# Patient Record
Sex: Female | Born: 1960 | Race: White | Hispanic: No | Smoking: Former smoker
Health system: Southern US, Community
[De-identification: ages and names within clinical notes are randomized; demographics above are authoritative.]

## PROBLEM LIST (undated history)

## (undated) DIAGNOSIS — K759 Inflammatory liver disease, unspecified: Secondary | ICD-10-CM

## (undated) DIAGNOSIS — E7800 Pure hypercholesterolemia, unspecified: Secondary | ICD-10-CM

## (undated) DIAGNOSIS — F329 Major depressive disorder, single episode, unspecified: Secondary | ICD-10-CM

## (undated) DIAGNOSIS — F32A Depression, unspecified: Secondary | ICD-10-CM

## (undated) DIAGNOSIS — G8929 Other chronic pain: Secondary | ICD-10-CM

## (undated) DIAGNOSIS — M75100 Unspecified rotator cuff tear or rupture of unspecified shoulder, not specified as traumatic: Secondary | ICD-10-CM

## (undated) DIAGNOSIS — Z973 Presence of spectacles and contact lenses: Secondary | ICD-10-CM

## (undated) DIAGNOSIS — Z8719 Personal history of other diseases of the digestive system: Secondary | ICD-10-CM

## (undated) DIAGNOSIS — K2270 Barrett's esophagus without dysplasia: Secondary | ICD-10-CM

## (undated) DIAGNOSIS — M1990 Unspecified osteoarthritis, unspecified site: Secondary | ICD-10-CM

## (undated) DIAGNOSIS — M549 Dorsalgia, unspecified: Secondary | ICD-10-CM

## (undated) DIAGNOSIS — F419 Anxiety disorder, unspecified: Secondary | ICD-10-CM

## (undated) DIAGNOSIS — K219 Gastro-esophageal reflux disease without esophagitis: Secondary | ICD-10-CM

## (undated) DIAGNOSIS — E039 Hypothyroidism, unspecified: Secondary | ICD-10-CM

## (undated) DIAGNOSIS — E119 Type 2 diabetes mellitus without complications: Secondary | ICD-10-CM

## (undated) DIAGNOSIS — I10 Essential (primary) hypertension: Secondary | ICD-10-CM

## (undated) HISTORY — DX: Barrett's esophagus without dysplasia: K22.70

## (undated) HISTORY — PX: BACK SURGERY: SHX140

## (undated) HISTORY — PX: CYSTECTOMY: SUR359

## (undated) HISTORY — PX: CARPAL TUNNEL RELEASE: SHX101

## (undated) HISTORY — PX: COLONOSCOPY: SHX174

## (undated) HISTORY — PX: HERNIA REPAIR: SHX51

## (undated) HISTORY — DX: Essential (primary) hypertension: I10

## (undated) HISTORY — PX: KNEE SURGERY: SHX244

## (undated) HISTORY — PX: SHOULDER ARTHROSCOPY: SHX128

## (undated) HISTORY — PX: OTHER SURGICAL HISTORY: SHX169

## (undated) HISTORY — PX: ANAL FISSURE REPAIR: SHX2312

## (undated) HISTORY — PX: CERVICAL ABLATION: SHX5771

---

## 2000-05-04 ENCOUNTER — Encounter: Admission: RE | Admit: 2000-05-04 | Discharge: 2000-05-04 | Payer: Self-pay | Admitting: Family Medicine

## 2000-05-04 ENCOUNTER — Encounter: Payer: Self-pay | Admitting: Family Medicine

## 2007-08-28 ENCOUNTER — Encounter: Admission: RE | Admit: 2007-08-28 | Discharge: 2007-08-28 | Payer: Self-pay | Admitting: General Surgery

## 2013-05-19 ENCOUNTER — Encounter (INDEPENDENT_AMBULATORY_CARE_PROVIDER_SITE_OTHER): Payer: Self-pay

## 2013-10-09 ENCOUNTER — Other Ambulatory Visit (HOSPITAL_COMMUNITY)
Admission: RE | Admit: 2013-10-09 | Discharge: 2013-10-09 | Disposition: A | Discharge status: Home / Self Care | Payer: BC Managed Care – PPO | Source: Ambulatory Visit | Attending: Internal Medicine | Admitting: Internal Medicine

## 2013-10-09 DIAGNOSIS — Z01419 Encounter for gynecological examination (general) (routine) without abnormal findings: Secondary | ICD-10-CM | POA: Insufficient documentation

## 2014-03-13 ENCOUNTER — Other Ambulatory Visit: Payer: Self-pay | Admitting: Orthopaedic Surgery

## 2014-03-13 DIAGNOSIS — M545 Low back pain: Secondary | ICD-10-CM

## 2014-03-22 ENCOUNTER — Ambulatory Visit
Admission: RE | Admit: 2014-03-22 | Discharge: 2014-03-22 | Disposition: A | Discharge status: Home / Self Care | Payer: BC Managed Care – PPO | Source: Ambulatory Visit | Attending: Orthopaedic Surgery | Admitting: Orthopaedic Surgery

## 2014-03-22 DIAGNOSIS — M545 Low back pain: Secondary | ICD-10-CM

## 2014-05-13 ENCOUNTER — Ambulatory Visit (INDEPENDENT_AMBULATORY_CARE_PROVIDER_SITE_OTHER): Payer: BC Managed Care – PPO

## 2014-05-13 DIAGNOSIS — IMO0002 Reserved for concepts with insufficient information to code with codable children: Secondary | ICD-10-CM

## 2014-05-13 DIAGNOSIS — R5381 Other malaise: Secondary | ICD-10-CM

## 2014-05-13 DIAGNOSIS — M48061 Spinal stenosis, lumbar region without neurogenic claudication: Secondary | ICD-10-CM

## 2014-05-13 DIAGNOSIS — M545 Low back pain, unspecified: Secondary | ICD-10-CM

## 2014-05-13 DIAGNOSIS — R262 Difficulty in walking, not elsewhere classified: Secondary | ICD-10-CM

## 2014-05-13 DIAGNOSIS — M47817 Spondylosis without myelopathy or radiculopathy, lumbosacral region: Secondary | ICD-10-CM

## 2014-05-18 ENCOUNTER — Encounter (INDEPENDENT_AMBULATORY_CARE_PROVIDER_SITE_OTHER): Payer: BC Managed Care – PPO | Admitting: Physical Therapy

## 2014-05-18 DIAGNOSIS — M545 Low back pain, unspecified: Secondary | ICD-10-CM

## 2014-05-18 DIAGNOSIS — M47817 Spondylosis without myelopathy or radiculopathy, lumbosacral region: Secondary | ICD-10-CM

## 2014-05-18 DIAGNOSIS — IMO0002 Reserved for concepts with insufficient information to code with codable children: Secondary | ICD-10-CM

## 2014-05-18 DIAGNOSIS — R262 Difficulty in walking, not elsewhere classified: Secondary | ICD-10-CM

## 2014-05-18 DIAGNOSIS — M48061 Spinal stenosis, lumbar region without neurogenic claudication: Secondary | ICD-10-CM

## 2014-05-18 DIAGNOSIS — R5381 Other malaise: Secondary | ICD-10-CM

## 2014-05-20 ENCOUNTER — Telehealth: Payer: Self-pay | Admitting: Hematology & Oncology

## 2014-05-20 NOTE — Telephone Encounter (Signed)
I spoke w NEW PATIENT today to remind them of their appointment with Dr. Ennever. Also, advised them to bring all medication bottles and insurance card information. ° °

## 2014-05-21 ENCOUNTER — Encounter: Payer: Self-pay | Admitting: Hematology & Oncology

## 2014-05-21 ENCOUNTER — Ambulatory Visit: Payer: BC Managed Care – PPO

## 2014-05-21 ENCOUNTER — Ambulatory Visit (HOSPITAL_BASED_OUTPATIENT_CLINIC_OR_DEPARTMENT_OTHER): Payer: BC Managed Care – PPO | Admitting: Hematology & Oncology

## 2014-05-21 ENCOUNTER — Ambulatory Visit (HOSPITAL_BASED_OUTPATIENT_CLINIC_OR_DEPARTMENT_OTHER): Payer: BC Managed Care – PPO | Admitting: Lab

## 2014-05-21 ENCOUNTER — Encounter (INDEPENDENT_AMBULATORY_CARE_PROVIDER_SITE_OTHER): Payer: BC Managed Care – PPO | Admitting: Physical Therapy

## 2014-05-21 VITALS — BP 119/82 | HR 90 | Temp 98.4°F | Resp 14 | Ht 63.0 in | Wt 169.0 lb

## 2014-05-21 DIAGNOSIS — M545 Low back pain, unspecified: Secondary | ICD-10-CM

## 2014-05-21 DIAGNOSIS — M25076 Hemarthrosis, unspecified foot: Secondary | ICD-10-CM

## 2014-05-21 DIAGNOSIS — M549 Dorsalgia, unspecified: Secondary | ICD-10-CM

## 2014-05-21 DIAGNOSIS — M47817 Spondylosis without myelopathy or radiculopathy, lumbosacral region: Secondary | ICD-10-CM

## 2014-05-21 DIAGNOSIS — IMO0002 Reserved for concepts with insufficient information to code with codable children: Secondary | ICD-10-CM

## 2014-05-21 DIAGNOSIS — R5381 Other malaise: Secondary | ICD-10-CM

## 2014-05-21 DIAGNOSIS — D472 Monoclonal gammopathy: Secondary | ICD-10-CM

## 2014-05-21 DIAGNOSIS — E119 Type 2 diabetes mellitus without complications: Secondary | ICD-10-CM

## 2014-05-21 DIAGNOSIS — M25073 Hemarthrosis, unspecified ankle: Secondary | ICD-10-CM

## 2014-05-21 DIAGNOSIS — M48061 Spinal stenosis, lumbar region without neurogenic claudication: Secondary | ICD-10-CM

## 2014-05-21 LAB — CBC WITH DIFFERENTIAL (CANCER CENTER ONLY)
BASO#: 0.1 10*3/uL (ref 0.0–0.2)
BASO%: 0.7 % (ref 0.0–2.0)
EOS%: 2.4 % (ref 0.0–7.0)
Eosinophils Absolute: 0.2 10*3/uL (ref 0.0–0.5)
HEMATOCRIT: 43.8 % (ref 34.8–46.6)
HGB: 15 g/dL (ref 11.6–15.9)
LYMPH#: 2.2 10*3/uL (ref 0.9–3.3)
LYMPH%: 24.1 % (ref 14.0–48.0)
MCH: 30.6 pg (ref 26.0–34.0)
MCHC: 34.2 g/dL (ref 32.0–36.0)
MCV: 89 fL (ref 81–101)
MONO#: 0.6 10*3/uL (ref 0.1–0.9)
MONO%: 6.4 % (ref 0.0–13.0)
NEUT#: 6.1 10*3/uL (ref 1.5–6.5)
NEUT%: 66.4 % (ref 39.6–80.0)
PLATELETS: 360 10*3/uL (ref 145–400)
RBC: 4.9 10*6/uL (ref 3.70–5.32)
RDW: 12.6 % (ref 11.1–15.7)
WBC: 9.2 10*3/uL (ref 3.9–10.0)

## 2014-05-21 LAB — CMP (CANCER CENTER ONLY)
ALT(SGPT): 21 U/L (ref 10–47)
AST: 18 U/L (ref 11–38)
Albumin: 3.6 g/dL (ref 3.3–5.5)
Alkaline Phosphatase: 55 U/L (ref 26–84)
BILIRUBIN TOTAL: 0.7 mg/dL (ref 0.20–1.60)
BUN: 13 mg/dL (ref 7–22)
CALCIUM: 9.8 mg/dL (ref 8.0–10.3)
CHLORIDE: 96 meq/L — AB (ref 98–108)
CO2: 28 mEq/L (ref 18–33)
Creat: 0.5 mg/dl — ABNORMAL LOW (ref 0.6–1.2)
Glucose, Bld: 280 mg/dL — ABNORMAL HIGH (ref 73–118)
Potassium: 3.7 mEq/L (ref 3.3–4.7)
Sodium: 139 mEq/L (ref 128–145)
Total Protein: 7.8 g/dL (ref 6.4–8.1)

## 2014-05-21 NOTE — Progress Notes (Signed)
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 NEW PATIENT  : I am here because my back really hurts and that he may think there is a blood problem.  HPI: April Obrien is a very nice 53 year old white female. She recently has a see by Dr. Deland Pretty. She has quite a few issues. She has diabetes. She has hypo-thyroidism. She also has hyper lipidemia. She is on quite a few medications. Patient having quite a lot of problems with her back. She underwent an MRI of the lower back. She has some arthritic issues. There doesn't of a salt looked as if there was a bone marrow her blood issue.  She had some blood work done. I think this may have been done by her orthopedic doctor. She is found to have a monoclonal spike of 0.15 g/L. This was so swelling identified as an IgA kappa protein. Her IgA level was 458. His normal IgG and IgM level. She is not anemic. She had a hemoglobin of 14 hematocrit of 41. The count was 345. Her BUN was 21 creatinine was 0.9. Her calcium 9.8. She had a normal total protein and albumin.  Because of the monoclonal spike, she was referred to the [facility] for an evaluation.  The back pain is been quite intense. It does not radiate. She apparently has seen a couple orthopedist who would not do surgery on her.  She has not had problems with infections. She's not had weight loss or weight gain. She is not a vegetarian. She's had no rashes. There's been no change in bowel or bladder habits.. She's not had a mammogram.   No past medical history on file.:  No past surgical history on file.:  Current outpatient prescriptions:Canagliflozin-Metformin HCl (INVOKAMET) [phone] MG TABS, Take by mouth 2 (two) times daily., Disp: , Rfl: ;  celecoxib (CELEBREX) 200 MG capsule, Take 200 mg by mouth daily., Disp: , Rfl: ;  FLUoxetine (PROZAC) 20 MG capsule, Take 20 mg by mouth daily. 20 mg one day and 40 mg the other day and  repeat, Disp: , Rfl:  levothyroxine (SYNTHROID, LEVOTHROID) 112 MCG tablet, Take 112 mcg by mouth daily before breakfast., Disp: , Rfl: ;  omeprazole (PRILOSEC) 40 MG capsule, Take 40 mg by mouth daily., Disp: , Rfl: ;  oxyCODONE-acetaminophen (PERCOCET/ROXICET) 5-325 MG per tablet, Take by mouth every 6 (six) hours as needed for severe pain., Disp: , Rfl: ;  simvastatin (ZOCOR) 40 MG tablet, Take 40 mg by mouth daily., Disp: , Rfl:  TraMADol HCl 50 MG TBDP, Take by mouth as needed., Disp: , Rfl: ;  traZODone (DESYREL) 50 MG tablet, Take 50 mg by mouth at bedtime., Disp: , Rfl: ;  cyclobenzaprine (FLEXERIL) 5 MG tablet, Take 5 mg by mouth as needed for muscle spasms., Disp: , Rfl: :  :  No Known Allergies:  No family history on file.:  History   Social History  . Marital Status: Single    Spouse Name: N/A    Number of Children: N/A  . Years of Education: N/A   Occupational History  . Not on file.   Social History Main Topics  . Smoking status: Former Smoker -- 1.00 packs/day for 26 years    Types: Cigarettes    Start date: 01/20/1975    Quit date: 12/19/2001  . Smokeless tobacco: Never Used     Comment: quit smoking  13 years ago  .  Alcohol Use: Not on file  . Drug Use: Not on file  . Sexual Activity: Not on file   Other Topics Concern  . Not on file   Social History Narrative  . No narrative on file  :  Pertinent items are noted in HPI.  Exam: @IPVITALS @  well-developed and well-nourished white female in no obvious distress. Her vital signs show a temperature of 98.4. Pulse 90. Blood pressure 119/82. Weight is 169 pounds. Head and neck exam shows no ocular or oral lesions. She has no palpable cervical or supraclavicular lymph nodes. Lungs are clear. Cardiac exam regular rate and rhythm with no murmurs rubs or bruits. Abdomen is soft. She has good bowel sounds. There is no fluid wave. There is no palpable liver or spleen tip. Back exam shows some tenderness over the  lumbosacral spine. There is some slight muscle spasms. Extremities shows no clubbing cyanosis or edema. Neurological exam shows no focal neurological deficits. Skin exam shows no rashes, ecchymoses or petechia.     Recent Labs  05/21/14 1042  WBC 9.2  HGB 15.0  HCT 43.8  PLT 360    Recent Labs  05/21/14 1042  NA 139  K 3.7  CL 96*  CO2 28  GLUCOSE 280*  BUN 13  CREATININE 0.5*  CALCIUM 9.8    Blood smear review: Normochromic and normocytic population of red blood cells. There are no nucleated red cells. There is no rouleau formation. There is no teardrop cells. There is no schistocytes or spherocytes. White cells appear  normal in morphology and maturation. There is no hypersegmented polys. There is no atypical lymphocytes. I see no plasma cells. Platelets are adequate in number and size.  Pathology: No data     Assessment and Plan: 53 year old female. Looks like she has an MGUS. The periods. IgA kappa MGUS.  I have to believe that this is related to our her other medical problems. She's quite a few medical issues. I don't think there is anything that is related to her having myeloma.  I am checking a 24-hour urine on her.  I don't think she needs a bone marrow test.  I don't think there is any other malignancy. I told her that she really needs to have a mammogram done. Hopefully she'll get this done.  I would think that if he had myeloma, the MRI would have shown something with the bones or least a bone marrow.  I spent a good 45 minutes talking to her and her mom. They're both very very nice. I explained to her what I thought she had. I explained to her what an MGUS was.  I think we can probably get her back in 6 months. The [facility] could in 6 months, that it would probably get her back yearly.  I answered all their questions.

## 2014-05-25 ENCOUNTER — Encounter (INDEPENDENT_AMBULATORY_CARE_PROVIDER_SITE_OTHER): Payer: BC Managed Care – PPO | Admitting: Physical Therapy

## 2014-05-25 ENCOUNTER — Other Ambulatory Visit: Payer: Self-pay | Admitting: Lab

## 2014-05-25 DIAGNOSIS — M47817 Spondylosis without myelopathy or radiculopathy, lumbosacral region: Secondary | ICD-10-CM

## 2014-05-25 DIAGNOSIS — D472 Monoclonal gammopathy: Secondary | ICD-10-CM

## 2014-05-25 DIAGNOSIS — M545 Low back pain, unspecified: Secondary | ICD-10-CM

## 2014-05-25 DIAGNOSIS — R5381 Other malaise: Secondary | ICD-10-CM

## 2014-05-25 DIAGNOSIS — IMO0002 Reserved for concepts with insufficient information to code with codable children: Secondary | ICD-10-CM

## 2014-05-25 DIAGNOSIS — R262 Difficulty in walking, not elsewhere classified: Secondary | ICD-10-CM

## 2014-05-25 DIAGNOSIS — M48061 Spinal stenosis, lumbar region without neurogenic claudication: Secondary | ICD-10-CM

## 2014-05-25 LAB — PROTEIN ELECTROPHORESIS, SERUM, WITH REFLEX
Albumin ELP: 53.8 % — ABNORMAL LOW (ref 55.8–66.1)
Alpha-1-Globulin: 10.2 % — ABNORMAL HIGH (ref 2.9–4.9)
Alpha-2-Globulin: 11.9 % — ABNORMAL HIGH (ref 7.1–11.8)
Beta 2: 7.4 % — ABNORMAL HIGH (ref 3.2–6.5)
Beta Globulin: 7.6 % — ABNORMAL HIGH (ref 4.7–7.2)
Gamma Globulin: 9.1 % — ABNORMAL LOW (ref 11.1–18.8)
Total Protein, Serum Electrophoresis: 7.3 g/dL (ref 6.0–8.3)

## 2014-05-25 LAB — LACTATE DEHYDROGENASE: LDH: 126 U/L (ref 94–250)

## 2014-05-25 LAB — KAPPA/LAMBDA LIGHT CHAINS
KAPPA FREE LGHT CHN: 0.68 mg/dL (ref 0.33–1.94)
KAPPA LAMBDA RATIO: 0.8 (ref 0.26–1.65)
Lambda Free Lght Chn: 0.85 mg/dL (ref 0.57–2.63)

## 2014-05-25 LAB — IGG, IGA, IGM
IGA: 470 mg/dL — AB (ref 69–380)
IGG (IMMUNOGLOBIN G), SERUM: 770 mg/dL (ref 690–1700)
IGM, SERUM: 49 mg/dL — AB (ref 52–322)

## 2014-05-25 LAB — IFE INTERPRETATION

## 2014-05-25 LAB — BETA 2 MICROGLOBULIN, SERUM: Beta-2 Microglobulin: 1.96 mg/L

## 2014-05-27 LAB — UIFE/LIGHT CHAINS/TP QN, 24-HR UR
ALPHA 1 UR: DETECTED — AB
Albumin, U: DETECTED
Alpha 2, Urine: DETECTED — AB
Beta, Urine: DETECTED — AB
Gamma Globulin, Urine: DETECTED — AB
TOTAL PROTEIN, URINE-UPE24: 12 mg/dL (ref 5–24)
Time: 24 hours
Total Protein, Urine-Ur/day: 288 mg/d — ABNORMAL HIGH (ref <150)
VOLUME, URINE-UPE24: 2400 mL

## 2014-05-28 ENCOUNTER — Encounter: Payer: BC Managed Care – PPO | Admitting: Physical Therapy

## 2014-05-28 ENCOUNTER — Telehealth: Payer: Self-pay | Admitting: *Deleted

## 2014-05-28 NOTE — Telephone Encounter (Signed)
Message copied by Rico Ala on Thu May 28, 2014  3:52 PM ------      Message from: Burney Gauze R      Created: Wed May 27, 2014  6:25 PM       Call - urine test shows NO bad protein for myeloma !! pete ------

## 2014-06-03 ENCOUNTER — Other Ambulatory Visit: Payer: Self-pay | Admitting: Specialist

## 2014-06-03 ENCOUNTER — Encounter: Payer: BC Managed Care – PPO | Admitting: Physical Therapy

## 2014-06-03 DIAGNOSIS — M79604 Pain in right leg: Secondary | ICD-10-CM

## 2014-06-03 DIAGNOSIS — M5442 Lumbago with sciatica, left side: Secondary | ICD-10-CM

## 2014-06-04 ENCOUNTER — Encounter: Payer: BC Managed Care – PPO | Admitting: Physical Therapy

## 2014-06-09 ENCOUNTER — Ambulatory Visit
Admission: RE | Admit: 2014-06-09 | Discharge: 2014-06-09 | Disposition: A | Discharge status: Home / Self Care | Payer: BC Managed Care – PPO | Source: Ambulatory Visit | Attending: Specialist | Admitting: Specialist

## 2014-06-09 VITALS — BP 110/58 | HR 79

## 2014-06-09 DIAGNOSIS — M79604 Pain in right leg: Secondary | ICD-10-CM

## 2014-06-09 DIAGNOSIS — M5442 Lumbago with sciatica, left side: Secondary | ICD-10-CM

## 2014-06-09 MED ORDER — DIAZEPAM 5 MG PO TABS
10.0000 mg | ORAL_TABLET | Freq: Once | ORAL | Status: AC
  Administered 2014-06-09: 10 mg via ORAL

## 2014-06-09 MED ORDER — IOHEXOL 180 MG/ML  SOLN
15.0000 mL | Freq: Once | INTRAMUSCULAR | Status: AC | PRN
Start: 2014-06-09 — End: 2014-06-09
  Administered 2014-06-09: 15 mL via INTRATHECAL

## 2014-06-09 MED ORDER — MEPERIDINE HCL 100 MG/ML IJ SOLN
100.0000 mg | Freq: Once | INTRAMUSCULAR | Status: AC
  Administered 2014-06-09: 100 mg via INTRAMUSCULAR

## 2014-06-09 MED ORDER — ONDANSETRON HCL 4 MG/2ML IJ SOLN
4.0000 mg | Freq: Once | INTRAMUSCULAR | Status: AC
Start: 2014-06-09 — End: 2014-06-09
  Administered 2014-06-09: 4 mg via INTRAMUSCULAR

## 2014-06-09 NOTE — Discharge Instructions (Signed)
Myelogram Discharge Instructions  1. Go home and rest quietly for the next 24 hours.  It is important to lie flat for the next 24 hours.  Get up only to go to the restroom.  You may lie in the bed or on a couch on your back, your stomach, your left side or your right side.  You may have one pillow under your head.  You may have pillows between your knees while you are on your side or under your knees while you are on your back.  2. DO NOT drive today.  Recline the seat as far back as it will go, while still wearing your seat belt, on the way home.  3. You may get up to go to the bathroom as needed.  You may sit up for 10 minutes to eat.  You may resume your normal diet and medications unless otherwise indicated.  Drink lots of extra fluids today and tomorrow.  4. The incidence of headache, nausea, or vomiting is about 5% (one in 20 patients).  If you develop a headache, lie flat and drink plenty of fluids until the headache goes away.  Caffeinated beverages may be helpful.  If you develop severe nausea and vomiting or a headache that does not go away with flat bed rest, call [phone].  5. You may resume normal activities after your 24 hours of bed rest is over; however, do not exert yourself strongly or do any heavy lifting tomorrow. If when you get up you have a headache when standing, go back to bed and force fluids for another 24 hours.  6. Call your physician for a follow-up appointment.  The results of your myelogram will be sent directly to your physician by the following day.  7. If you have any questions or if complications develop after you arrive home, please call [phone].  Discharge instructions have been explained to the patient.  The patient, or the person responsible for the patient, fully understands these instructions.      May resume Prozac and Trazodone on Sept. 16, 2015, after 8:30 am.

## 2014-06-09 NOTE — Progress Notes (Signed)
Pt states she has been off prozac and trazodone for the past 2 days. Discharge instructions explained to pt.

## 2014-08-03 ENCOUNTER — Other Ambulatory Visit: Payer: Self-pay | Admitting: Neurosurgery

## 2014-08-04 ENCOUNTER — Encounter (HOSPITAL_COMMUNITY): Payer: Self-pay

## 2014-08-04 ENCOUNTER — Encounter (HOSPITAL_COMMUNITY)
Admission: RE | Admit: 2014-08-04 | Discharge: 2014-08-04 | Disposition: A | Discharge status: Home / Self Care | Payer: BC Managed Care – PPO | Source: Ambulatory Visit | Attending: Neurosurgery | Admitting: Neurosurgery

## 2014-08-04 HISTORY — DX: Presence of spectacles and contact lenses: Z97.3

## 2014-08-04 HISTORY — DX: Hypothyroidism, unspecified: E03.9

## 2014-08-04 HISTORY — DX: Dorsalgia, unspecified: M54.9

## 2014-08-04 HISTORY — DX: Pure hypercholesterolemia, unspecified: E78.00

## 2014-08-04 HISTORY — DX: Major depressive disorder, single episode, unspecified: F32.9

## 2014-08-04 HISTORY — DX: Other chronic pain: G89.29

## 2014-08-04 HISTORY — DX: Gastro-esophageal reflux disease without esophagitis: K21.9

## 2014-08-04 HISTORY — DX: Inflammatory liver disease, unspecified: K75.9

## 2014-08-04 HISTORY — DX: Depression, unspecified: F32.A

## 2014-08-04 HISTORY — DX: Type 2 diabetes mellitus without complications: E11.9

## 2014-08-04 LAB — COMPREHENSIVE METABOLIC PANEL
ALBUMIN: 4 g/dL (ref 3.5–5.2)
ALT: 13 U/L (ref 0–35)
AST: 13 U/L (ref 0–37)
Alkaline Phosphatase: 54 U/L (ref 39–117)
Anion gap: 20 — ABNORMAL HIGH (ref 5–15)
BILIRUBIN TOTAL: 0.3 mg/dL (ref 0.3–1.2)
BUN: 21 mg/dL (ref 6–23)
CHLORIDE: 99 meq/L (ref 96–112)
CO2: 20 mEq/L (ref 19–32)
Calcium: 10.5 mg/dL (ref 8.4–10.5)
Creatinine, Ser: 0.73 mg/dL (ref 0.50–1.10)
GFR calc Af Amer: >90 mL/min (ref >90)
GFR calc non Af Amer: >90 mL/min (ref >90)
Glucose, Bld: 162 mg/dL — ABNORMAL HIGH (ref 70–99)
POTASSIUM: 4.1 meq/L (ref 3.7–5.3)
Sodium: 139 mEq/L (ref 137–147)
Total Protein: 7.6 g/dL (ref 6.0–8.3)

## 2014-08-04 LAB — CBC
HEMATOCRIT: 45 % (ref 36.0–46.0)
HEMOGLOBIN: 15.4 g/dL — AB (ref 12.0–15.0)
MCH: 31 pg (ref 26.0–34.0)
MCHC: 34.2 g/dL (ref 30.0–36.0)
MCV: 90.5 fL (ref 78.0–100.0)
Platelets: 370 10*3/uL (ref 150–400)
RBC: 4.97 MIL/uL (ref 3.87–5.11)
RDW: 12.5 % (ref 11.5–15.5)
WBC: 10.1 10*3/uL (ref 4.0–10.5)

## 2014-08-04 LAB — TYPE AND SCREEN
ABO/RH(D): A POS
Antibody Screen: NEGATIVE

## 2014-08-04 LAB — SURGICAL PCR SCREEN
MRSA, PCR: NEGATIVE
STAPHYLOCOCCUS AUREUS: NEGATIVE

## 2014-08-04 LAB — ABO/RH: ABO/RH(D): A POS

## 2014-08-04 NOTE — Pre-Procedure Instructions (Addendum)
April Obrien  08/04/2014   Your procedure is scheduled on:  Thursday, Nov. 12th   Report to [facility] Admitting at  10:30 AM.  Call this number if you have problems the morning of surgery: [phone]   Remember: DO NOT TAKE ANY DIABETIC MEDICATION ON THE MORNING OF PROCEDURE   Do not eat food or drink liquids after midnight Wednesday.   Take these medicines the morning of surgery with A SIP OF WATER: Prozac, Levothyroxine, Omeprazole,  If needed:Oxycodone for pain Stop taking Aspirin, vitamins, and herbal medications (co-enzyme Q-10,Omega-3 Fatty Acids (FISH OIL), CHROMIUM.   Do not take any NSAIDs ie: Ibuprofen, Advil, Naproxen or any medication containing Aspirin such as  (celecoxib (CELEBREX)   Do not wear jewelry, make-up or nail polish.  Do not wear lotions, powders, or perfumes. You may NOT wear deodorant.  Do not shave underarms & legs  48 hours prior to surgery.   Do not bring valuables to the hospital.  [facility] is not responsible for any belongings or valuables.               Contacts, dentures or bridgework may not be worn into surgery.  Leave suitcase in the car. After surgery it may be brought to your room.  For patients admitted to the hospital, discharge time is determined by your treatment team.    Name and phone number of your driver:    Special Instructions: "Preparing for Surgery" instruction sheet.   Please read over the following fact sheets that you were given: Pain Booklet, Coughing and Deep Breathing, Blood Transfusion Information, MRSA Information and Surgical Site Infection Prevention

## 2014-08-05 MED ORDER — DEXAMETHASONE SODIUM PHOSPHATE 10 MG/ML IJ SOLN
10.0000 mg | INTRAMUSCULAR | Status: AC
  Administered 2014-08-06: 10 mg via INTRAVENOUS
  Filled 2014-08-05: qty 1

## 2014-08-05 MED ORDER — CEFAZOLIN SODIUM-DEXTROSE 2-3 GM-% IV SOLR
2.0000 g | INTRAVENOUS | Status: AC
  Administered 2014-08-06: 2 g via INTRAVENOUS
  Filled 2014-08-05: qty 50

## 2014-08-05 NOTE — Progress Notes (Signed)
Anesthesia Chart Review:  Patient is a 53 year old female scheduled for L4-5 PLIF on 08/06/14 by Dr. Hal Neer.  History includes recent former smoker (quit 06/25/14), GERD, DM2, hypothyroidism, hypercholesterolemia, hepatitis A, depression. She was seen by hematologist Dr. Marin Olp in 04/2014 for a monoclonal spike, possible MGUS with plans for 6 month follow-up. PCP is Dr. Deland Pretty.   Meds: Vit C, chromium, Invokamet, Prozac, MVI, fish oil, Celebrex, Vit D, Co-enzyme Q, Flexeril, levothyroxine, Prilosec, Percocet, Zocor.  EKG on 08/04/14: NSR, LAFB. Poor r wave progression. Currently there are no comparison EKGs available--I did call and leave a voice message and faxed a request this morning to her PCP to request a prior EKG if ever done there.  Preoperative labs noted.    No PCP records received by 1:30 PM, so I reviewed above with anesthesiologist Dr. Deatra Canter who agreed that if patient was asymptomatic from a CV standpoint then she should be able to  proceed as planned.  I did call and speak with patient.  She denied chest pain, SOB, edema, palpitations, syncope.  She has had back and leg pain for about a year now with worsening of her symptoms around July of this year that has further limited her activity level.  Prior to this she was able to clean her house and walk up 1-2 flights of stairs.  She reports good DM control.  She will meet with her assigned anesthesiologist tomorrow but If no acute changes then anticipate she can proceed.   George Hugh [facility]/Anesthesiology Phone [phone] 08/05/2014 1:30 PM

## 2014-08-06 ENCOUNTER — Encounter (HOSPITAL_COMMUNITY)
Admission: RE | Disposition: A | Discharge status: Home / Self Care | Payer: Self-pay | Source: Ambulatory Visit | Attending: Neurosurgery

## 2014-08-06 ENCOUNTER — Inpatient Hospital Stay (HOSPITAL_COMMUNITY): Payer: BC Managed Care – PPO | Admitting: Vascular Surgery

## 2014-08-06 ENCOUNTER — Encounter (HOSPITAL_COMMUNITY): Payer: Self-pay | Admitting: Certified Registered Nurse Anesthetist

## 2014-08-06 ENCOUNTER — Inpatient Hospital Stay (HOSPITAL_COMMUNITY)
Admission: RE | Admit: 2014-08-06 | Discharge: 2014-08-07 | DRG: 458 | Disposition: A | Discharge status: Home / Self Care | Payer: BC Managed Care – PPO | Source: Ambulatory Visit | Attending: Neurosurgery | Admitting: Neurosurgery

## 2014-08-06 ENCOUNTER — Inpatient Hospital Stay (HOSPITAL_COMMUNITY): Payer: BC Managed Care – PPO | Admitting: Certified Registered Nurse Anesthetist

## 2014-08-06 ENCOUNTER — Inpatient Hospital Stay (HOSPITAL_COMMUNITY): Payer: BC Managed Care – PPO

## 2014-08-06 DIAGNOSIS — M549 Dorsalgia, unspecified: Secondary | ICD-10-CM | POA: Diagnosis present

## 2014-08-06 DIAGNOSIS — E039 Hypothyroidism, unspecified: Secondary | ICD-10-CM | POA: Diagnosis present

## 2014-08-06 DIAGNOSIS — E119 Type 2 diabetes mellitus without complications: Secondary | ICD-10-CM | POA: Diagnosis present

## 2014-08-06 DIAGNOSIS — Z87891 Personal history of nicotine dependence: Secondary | ICD-10-CM

## 2014-08-06 DIAGNOSIS — K219 Gastro-esophageal reflux disease without esophagitis: Secondary | ICD-10-CM | POA: Diagnosis present

## 2014-08-06 DIAGNOSIS — F329 Major depressive disorder, single episode, unspecified: Secondary | ICD-10-CM | POA: Diagnosis present

## 2014-08-06 DIAGNOSIS — M4806 Spinal stenosis, lumbar region: Secondary | ICD-10-CM | POA: Diagnosis present

## 2014-08-06 DIAGNOSIS — M419 Scoliosis, unspecified: Principal | ICD-10-CM | POA: Diagnosis present

## 2014-08-06 DIAGNOSIS — M48061 Spinal stenosis, lumbar region without neurogenic claudication: Secondary | ICD-10-CM

## 2014-08-06 DIAGNOSIS — M5136 Other intervertebral disc degeneration, lumbar region: Secondary | ICD-10-CM | POA: Diagnosis present

## 2014-08-06 DIAGNOSIS — Z79899 Other long term (current) drug therapy: Secondary | ICD-10-CM

## 2014-08-06 DIAGNOSIS — E780 Pure hypercholesterolemia: Secondary | ICD-10-CM | POA: Diagnosis present

## 2014-08-06 LAB — GLUCOSE, CAPILLARY
GLUCOSE-CAPILLARY: 158 mg/dL — AB (ref 70–99)
GLUCOSE-CAPILLARY: 179 mg/dL — AB (ref 70–99)
GLUCOSE-CAPILLARY: 220 mg/dL — AB (ref 70–99)
Glucose-Capillary: 246 mg/dL — ABNORMAL HIGH (ref 70–99)

## 2014-08-06 SURGERY — POSTERIOR LUMBAR FUSION 1 LEVEL
Anesthesia: General | Site: Back

## 2014-08-06 MED ORDER — HYDROMORPHONE HCL 1 MG/ML IJ SOLN
INTRAMUSCULAR | Status: AC
  Filled 2014-08-06: qty 1

## 2014-08-06 MED ORDER — DOCUSATE SODIUM 100 MG PO CAPS
100.0000 mg | ORAL_CAPSULE | Freq: Two times a day (BID) | ORAL | Status: DC
  Administered 2014-08-06 – 2014-08-07 (×2): 100 mg via ORAL
  Filled 2014-08-06 (×3): qty 1

## 2014-08-06 MED ORDER — GLYCOPYRROLATE 0.2 MG/ML IJ SOLN
INTRAMUSCULAR | Status: DC | PRN
  Administered 2014-08-06: 0.6 mg via INTRAVENOUS

## 2014-08-06 MED ORDER — INSULIN ASPART 100 UNIT/ML ~~LOC~~ SOLN
4.0000 [IU] | Freq: Three times a day (TID) | SUBCUTANEOUS | Status: DC
  Administered 2014-08-07: 4 [IU] via SUBCUTANEOUS

## 2014-08-06 MED ORDER — FLUOXETINE HCL 20 MG PO CAPS: 20.0000 mg | ORAL_CAPSULE | Freq: Every day | ORAL | Status: DC

## 2014-08-06 MED ORDER — MIDAZOLAM HCL 2 MG/2ML IJ SOLN
INTRAMUSCULAR | Status: AC
  Filled 2014-08-06: qty 2

## 2014-08-06 MED ORDER — 0.9 % SODIUM CHLORIDE (POUR BTL) OPTIME
TOPICAL | Status: DC | PRN
  Administered 2014-08-06: 1000 mL

## 2014-08-06 MED ORDER — BUPIVACAINE LIPOSOME 1.3 % IJ SUSP
20.0000 mL | Freq: Once | INTRAMUSCULAR | Status: DC
  Filled 2014-08-06: qty 20

## 2014-08-06 MED ORDER — PANTOPRAZOLE SODIUM 40 MG IV SOLR
40.0000 mg | Freq: Every day | INTRAVENOUS | Status: DC
  Administered 2014-08-06: 40 mg via INTRAVENOUS
  Filled 2014-08-06 (×2): qty 40

## 2014-08-06 MED ORDER — ARTIFICIAL TEARS OP OINT
TOPICAL_OINTMENT | OPHTHALMIC | Status: DC | PRN
  Administered 2014-08-06: 1 via OPHTHALMIC

## 2014-08-06 MED ORDER — ROCURONIUM BROMIDE 50 MG/5ML IV SOLN
INTRAVENOUS | Status: AC
  Filled 2014-08-06: qty 1

## 2014-08-06 MED ORDER — EPHEDRINE SULFATE 50 MG/ML IJ SOLN
INTRAMUSCULAR | Status: DC | PRN
  Administered 2014-08-06: 10 mg via INTRAVENOUS

## 2014-08-06 MED ORDER — LACTATED RINGERS IV SOLN
INTRAVENOUS | Status: DC | PRN
  Administered 2014-08-06 (×2): via INTRAVENOUS

## 2014-08-06 MED ORDER — THROMBIN 20000 UNITS EX SOLR
CUTANEOUS | Status: DC | PRN
  Administered 2014-08-06: 20 mL via TOPICAL

## 2014-08-06 MED ORDER — CEFAZOLIN SODIUM-DEXTROSE 2-3 GM-% IV SOLR
2.0000 g | Freq: Three times a day (TID) | INTRAVENOUS | Status: AC
  Administered 2014-08-06 – 2014-08-07 (×2): 2 g via INTRAVENOUS
  Filled 2014-08-06 (×3): qty 50

## 2014-08-06 MED ORDER — OXYCODONE-ACETAMINOPHEN 5-325 MG PO TABS
1.0000 | ORAL_TABLET | ORAL | Status: DC | PRN
  Administered 2014-08-06 – 2014-08-07 (×2): 2 via ORAL
  Filled 2014-08-06 (×2): qty 2

## 2014-08-06 MED ORDER — ACETAMINOPHEN 325 MG PO TABS: 650.0000 mg | ORAL_TABLET | ORAL | Status: DC | PRN

## 2014-08-06 MED ORDER — POTASSIUM CHLORIDE IN NACL 20-0.45 MEQ/L-% IV SOLN
INTRAVENOUS | Status: DC
  Administered 2014-08-06: 23:00:00 via INTRAVENOUS
  Filled 2014-08-06 (×4): qty 1000

## 2014-08-06 MED ORDER — LACTATED RINGERS IV SOLN
INTRAVENOUS | Status: DC
  Administered 2014-08-06: 11:00:00 via INTRAVENOUS

## 2014-08-06 MED ORDER — HYDROMORPHONE HCL 1 MG/ML IJ SOLN
INTRAMUSCULAR | Status: AC
  Administered 2014-08-06: 0.5 mg via INTRAVENOUS
  Filled 2014-08-06: qty 1

## 2014-08-06 MED ORDER — INSULIN ASPART 100 UNIT/ML ~~LOC~~ SOLN
0.0000 [IU] | Freq: Every day | SUBCUTANEOUS | Status: DC
  Administered 2014-08-06: 2 [IU] via SUBCUTANEOUS

## 2014-08-06 MED ORDER — ROCURONIUM BROMIDE 100 MG/10ML IV SOLN
INTRAVENOUS | Status: DC | PRN
  Administered 2014-08-06: 10 mg via INTRAVENOUS
  Administered 2014-08-06: 40 mg via INTRAVENOUS
  Administered 2014-08-06 (×2): 10 mg via INTRAVENOUS

## 2014-08-06 MED ORDER — MIDAZOLAM HCL 5 MG/5ML IJ SOLN
INTRAMUSCULAR | Status: DC | PRN
  Administered 2014-08-06: 2 mg via INTRAVENOUS

## 2014-08-06 MED ORDER — SODIUM CHLORIDE 0.9 % IJ SOLN: 3.0000 mL | INTRAMUSCULAR | Status: DC | PRN

## 2014-08-06 MED ORDER — SODIUM CHLORIDE 0.9 % IV SOLN: 250.0000 mL | INTRAVENOUS | Status: DC

## 2014-08-06 MED ORDER — PROMETHAZINE HCL 25 MG/ML IJ SOLN
6.2500 mg | INTRAMUSCULAR | Status: DC | PRN
  Administered 2014-08-06: 6.25 mg via INTRAVENOUS

## 2014-08-06 MED ORDER — ACETAMINOPHEN 650 MG RE SUPP: 650.0000 mg | RECTAL | Status: DC | PRN

## 2014-08-06 MED ORDER — HYDROMORPHONE HCL 1 MG/ML IJ SOLN
1.0000 mg | INTRAMUSCULAR | Status: DC | PRN
  Administered 2014-08-06 – 2014-08-07 (×3): 1.5 mg via INTRAMUSCULAR
  Filled 2014-08-06 (×3): qty 2

## 2014-08-06 MED ORDER — VANCOMYCIN HCL 1000 MG IV SOLR
INTRAVENOUS | Status: AC
  Filled 2014-08-06: qty 1000

## 2014-08-06 MED ORDER — SODIUM CHLORIDE 0.9 % IJ SOLN
3.0000 mL | Freq: Two times a day (BID) | INTRAMUSCULAR | Status: DC
  Administered 2014-08-06: 3 mL via INTRAVENOUS

## 2014-08-06 MED ORDER — INFLUENZA VAC SPLIT QUAD 0.5 ML IM SUSY
0.5000 mL | PREFILLED_SYRINGE | INTRAMUSCULAR | Status: AC
  Administered 2014-08-07: 0.5 mL via INTRAMUSCULAR
  Filled 2014-08-06: qty 0.5

## 2014-08-06 MED ORDER — OXYCODONE HCL 5 MG/5ML PO SOLN: 5.0000 mg | Freq: Once | ORAL | Status: DC | PRN

## 2014-08-06 MED ORDER — MENTHOL 3 MG MT LOZG: 1.0000 | LOZENGE | OROMUCOSAL | Status: DC | PRN

## 2014-08-06 MED ORDER — FLUOXETINE HCL 20 MG PO CAPS: 40.0000 mg | ORAL_CAPSULE | Freq: Every day | ORAL | Status: DC

## 2014-08-06 MED ORDER — FLUOXETINE HCL 20 MG PO CAPS
20.0000 mg | ORAL_CAPSULE | ORAL | Status: DC
  Administered 2014-08-07: 20 mg via ORAL
  Filled 2014-08-06: qty 1

## 2014-08-06 MED ORDER — FENTANYL CITRATE 0.05 MG/ML IJ SOLN
INTRAMUSCULAR | Status: AC
  Filled 2014-08-06: qty 5

## 2014-08-06 MED ORDER — BUPIVACAINE LIPOSOME 1.3 % IJ SUSP
INTRAMUSCULAR | Status: DC | PRN
  Administered 2014-08-06: 20 mL

## 2014-08-06 MED ORDER — LIDOCAINE HCL (CARDIAC) 20 MG/ML IV SOLN
INTRAVENOUS | Status: DC | PRN
  Administered 2014-08-06: 60 mg via INTRAVENOUS

## 2014-08-06 MED ORDER — HYDROMORPHONE HCL 1 MG/ML IJ SOLN
0.2500 mg | INTRAMUSCULAR | Status: DC | PRN
  Administered 2014-08-06 (×4): 0.5 mg via INTRAVENOUS

## 2014-08-06 MED ORDER — PROPOFOL 10 MG/ML IV BOLUS
INTRAVENOUS | Status: AC
  Filled 2014-08-06: qty 20

## 2014-08-06 MED ORDER — PROMETHAZINE HCL 25 MG/ML IJ SOLN
INTRAMUSCULAR | Status: AC
  Administered 2014-08-06: 6.25 mg via INTRAVENOUS
  Filled 2014-08-06: qty 1

## 2014-08-06 MED ORDER — SODIUM CHLORIDE 0.9 % IR SOLN
Status: DC | PRN
  Administered 2014-08-06: 500 mL

## 2014-08-06 MED ORDER — PHENOL 1.4 % MT LIQD: 1.0000 | OROMUCOSAL | Status: DC | PRN

## 2014-08-06 MED ORDER — ONDANSETRON HCL 4 MG/2ML IJ SOLN
INTRAMUSCULAR | Status: DC | PRN
  Administered 2014-08-06: 4 mg via INTRAVENOUS

## 2014-08-06 MED ORDER — INSULIN ASPART 100 UNIT/ML ~~LOC~~ SOLN
0.0000 [IU] | Freq: Three times a day (TID) | SUBCUTANEOUS | Status: DC
  Administered 2014-08-07: 5 [IU] via SUBCUTANEOUS

## 2014-08-06 MED ORDER — OXYCODONE HCL 5 MG PO TABS: 5.0000 mg | ORAL_TABLET | Freq: Once | ORAL | Status: DC | PRN

## 2014-08-06 MED ORDER — NEOSTIGMINE METHYLSULFATE 10 MG/10ML IV SOLN
INTRAVENOUS | Status: DC | PRN
  Administered 2014-08-06: 4 mg via INTRAVENOUS

## 2014-08-06 MED ORDER — ONDANSETRON HCL 4 MG/2ML IJ SOLN
4.0000 mg | INTRAMUSCULAR | Status: DC | PRN
  Filled 2014-08-06: qty 2

## 2014-08-06 MED ORDER — HYDROMORPHONE HCL 1 MG/ML IJ SOLN
0.5000 mg | INTRAMUSCULAR | Status: DC | PRN
  Administered 2014-08-06 – 2014-08-07 (×3): 0.5 mg via INTRAVENOUS
  Filled 2014-08-06: qty 1

## 2014-08-06 MED ORDER — ZOLPIDEM TARTRATE 5 MG PO TABS
5.0000 mg | ORAL_TABLET | Freq: Every evening | ORAL | Status: DC | PRN
  Administered 2014-08-07: 5 mg via ORAL
  Filled 2014-08-06: qty 1

## 2014-08-06 MED ORDER — OXYCODONE-ACETAMINOPHEN 5-325 MG PO TABS
ORAL_TABLET | ORAL | Status: AC
  Administered 2014-08-06: 2 via ORAL
  Filled 2014-08-06: qty 2

## 2014-08-06 MED ORDER — PROPOFOL 10 MG/ML IV BOLUS
INTRAVENOUS | Status: DC | PRN
  Administered 2014-08-06: 130 mg via INTRAVENOUS

## 2014-08-06 MED ORDER — FENTANYL CITRATE 0.05 MG/ML IJ SOLN
INTRAMUSCULAR | Status: DC | PRN
  Administered 2014-08-06: 50 ug via INTRAVENOUS
  Administered 2014-08-06: 100 ug via INTRAVENOUS
  Administered 2014-08-06: 50 ug via INTRAVENOUS
  Administered 2014-08-06: 150 ug via INTRAVENOUS
  Administered 2014-08-06 (×3): 50 ug via INTRAVENOUS

## 2014-08-06 MED ORDER — LEVOTHYROXINE SODIUM 112 MCG PO TABS
112.0000 ug | ORAL_TABLET | Freq: Every day | ORAL | Status: DC
  Administered 2014-08-07: 112 ug via ORAL
  Filled 2014-08-06 (×2): qty 1

## 2014-08-06 MED ORDER — SIMVASTATIN 40 MG PO TABS
40.0000 mg | ORAL_TABLET | Freq: Every day | ORAL | Status: DC
  Administered 2014-08-06 – 2014-08-07 (×2): 40 mg via ORAL
  Filled 2014-08-06 (×2): qty 1

## 2014-08-06 SURGICAL SUPPLY — 70 items
APL SKNCLS STERI-STRIP NONHPOA (GAUZE/BANDAGES/DRESSINGS) ×1
BAG DECANTER FOR FLEXI CONT (MISCELLANEOUS) ×2 IMPLANT
BENZOIN TINCTURE PRP APPL 2/3 (GAUZE/BANDAGES/DRESSINGS) ×3 IMPLANT
BLADE CLIPPER SURG (BLADE) IMPLANT
BONE EQUIVA 10CC (Bone Implant) ×1 IMPLANT
BRUSH SCRUB EZ PLAIN DRY (MISCELLANEOUS) ×2 IMPLANT
BUR CUTTER 7.0 ROUND (BURR) ×2 IMPLANT
BUR MATCHSTICK NEURO 3.0 LAGG (BURR) ×2 IMPLANT
CANISTER SUCT 3000ML (MISCELLANEOUS) ×2 IMPLANT
CONT SPEC 4OZ CLIKSEAL STRL BL (MISCELLANEOUS) ×4 IMPLANT
COVER BACK TABLE 60X90IN (DRAPES) ×2 IMPLANT
DRAPE C-ARM 42X72 X-RAY (DRAPES) ×4 IMPLANT
DRAPE C-ARMOR (DRAPES) ×1 IMPLANT
DRAPE LAPAROTOMY 100X72X124 (DRAPES) ×2 IMPLANT
DRAPE SURG 17X23 STRL (DRAPES) ×4 IMPLANT
DRSG OPSITE POSTOP 4X6 (GAUZE/BANDAGES/DRESSINGS) ×2 IMPLANT
DRSG TELFA 3X8 NADH (GAUZE/BANDAGES/DRESSINGS) ×2 IMPLANT
DURAPREP 26ML APPLICATOR (WOUND CARE) ×2 IMPLANT
ELECT REM PT RETURN 9FT ADLT (ELECTROSURGICAL) ×2
ELECTRODE REM PT RTRN 9FT ADLT (ELECTROSURGICAL) ×1 IMPLANT
EVACUATOR 1/8 PVC DRAIN (DRAIN) ×2 IMPLANT
GAUZE SPONGE 4X4 12PLY STRL (GAUZE/BANDAGES/DRESSINGS) ×2 IMPLANT
GAUZE SPONGE 4X4 16PLY XRAY LF (GAUZE/BANDAGES/DRESSINGS) IMPLANT
GLOVE BIOGEL PI IND STRL 8 (GLOVE) IMPLANT
GLOVE BIOGEL PI INDICATOR 8 (GLOVE) ×1
GLOVE ECLIPSE 7.5 STRL STRAW (GLOVE) ×3 IMPLANT
GLOVE ECLIPSE 8.0 STRL XLNG CF (GLOVE) ×4 IMPLANT
GLOVE EXAM NITRILE LRG STRL (GLOVE) IMPLANT
GLOVE EXAM NITRILE MD LF STRL (GLOVE) IMPLANT
GLOVE EXAM NITRILE XS STR PU (GLOVE) IMPLANT
GOWN STRL REUS W/ TWL LRG LVL3 (GOWN DISPOSABLE) IMPLANT
GOWN STRL REUS W/ TWL XL LVL3 (GOWN DISPOSABLE) ×2 IMPLANT
GOWN STRL REUS W/TWL 2XL LVL3 (GOWN DISPOSABLE) IMPLANT
GOWN STRL REUS W/TWL LRG LVL3 (GOWN DISPOSABLE) ×4
GOWN STRL REUS W/TWL XL LVL3 (GOWN DISPOSABLE) ×4
IMPLANT PEEK ARDIS 8 X 8 X 26 (Orthopedic Implant) ×2 IMPLANT
K-WIRE NITHNOL TROCAR TIP (WIRE) ×4 IMPLANT
KIT BASIN OR (CUSTOM PROCEDURE TRAY) ×2 IMPLANT
KIT ROOM TURNOVER OR (KITS) ×2 IMPLANT
LIQUID BAND (GAUZE/BANDAGES/DRESSINGS) IMPLANT
NDL HYPO 21X1.5 SAFETY (NEEDLE) IMPLANT
NEEDLE HYPO 21X1.5 SAFETY (NEEDLE) ×2 IMPLANT
NEEDLE HYPO 22GX1.5 SAFETY (NEEDLE) ×2 IMPLANT
NEEDLE TARGETING (NEEDLE) ×4 IMPLANT
NS IRRIG 1000ML POUR BTL (IV SOLUTION) ×2 IMPLANT
PACK LAMINECTOMY NEURO (CUSTOM PROCEDURE TRAY) ×2 IMPLANT
PAD ARMBOARD 7.5X6 YLW CONV (MISCELLANEOUS) ×6 IMPLANT
PAD DRESSING TELFA 3X8 NADH (GAUZE/BANDAGES/DRESSINGS) ×1 IMPLANT
PATTIES SURGICAL .75X.75 (GAUZE/BANDAGES/DRESSINGS) IMPLANT
PEDICLE ACCESS TOOL SHEATH ×1 IMPLANT
ROD BENT PERC 35MM (Rod) ×2 IMPLANT
SCREW POLYAXIA MIS 6.5X40MM (Screw) ×4 IMPLANT
SHEATH PAT (SHEATH) ×1 IMPLANT
SPONGE LAP 4X18 X RAY DECT (DISPOSABLE) IMPLANT
SPONGE SURGIFOAM ABS GEL 100 (HEMOSTASIS) ×2 IMPLANT
STRIP CLOSURE SKIN 1/2X4 (GAUZE/BANDAGES/DRESSINGS) ×4 IMPLANT
SUT PROLENE 0 CT 1 30 (SUTURE) ×1 IMPLANT
SUT VIC AB 0 CT1 18XCR BRD8 (SUTURE) ×1 IMPLANT
SUT VIC AB 0 CT1 8-18 (SUTURE) ×2
SUT VIC AB 2-0 OS6 18 (SUTURE) ×6 IMPLANT
SUT VIC AB 3-0 CP2 18 (SUTURE) ×2 IMPLANT
SYR 20CC LL (SYRINGE) ×1 IMPLANT
SYR 20ML ECCENTRIC (SYRINGE) ×2 IMPLANT
TAPE STRIPS DRAPE STRL (GAUZE/BANDAGES/DRESSINGS) ×1 IMPLANT
TOP CLSR SEQUOIA (Orthopedic Implant) ×4 IMPLANT
TOWEL OR 17X24 6PK STRL BLUE (TOWEL DISPOSABLE) ×2 IMPLANT
TOWEL OR 17X26 10 PK STRL BLUE (TOWEL DISPOSABLE) ×2 IMPLANT
TRAP SPECIMEN MUCOUS 40CC (MISCELLANEOUS) ×2 IMPLANT
TRAY FOLEY CATH 14FRSI W/METER (CATHETERS) ×2 IMPLANT
WATER STERILE IRR 1000ML POUR (IV SOLUTION) ×2 IMPLANT

## 2014-08-06 NOTE — Anesthesia Procedure Notes (Signed)
Procedure Name: Intubation Date/Time: 08/06/2014 12:41 PM Performed by: Trixie Deis A Pre-anesthesia Checklist: Patient identified, Timeout performed, Emergency Drugs available, Suction available and Patient being monitored Patient Re-evaluated:Patient Re-evaluated prior to inductionOxygen Delivery Method: Circle system utilized Preoxygenation: Pre-oxygenation with 100% oxygen Intubation Type: IV induction Ventilation: Mask ventilation without difficulty Laryngoscope Size: Mac and 3 Grade View: Grade I Tube type: Oral Tube size: 7.0 mm Number of attempts: 1 Airway Equipment and Method: Stylet Placement Confirmation: ETT inserted through vocal cords under direct vision,  breath sounds checked- equal and bilateral and positive ETCO2 Secured at: 21 cm Tube secured with: Tape Dental Injury: Teeth and Oropharynx as per pre-operative assessment

## 2014-08-06 NOTE — Anesthesia Postprocedure Evaluation (Signed)
  Anesthesia Post-op Note  Patient: April Obrien  Procedure(s) Performed: Procedure(s) with comments: POSTERIOR LUMBAR FUSION 1 LEVEL (N/A) - POSTERIOR LUMBAR FUSION 1 LEVEL LUMBAR 4-5  Patient Location: PACU  Anesthesia Type:General  Level of Consciousness: awake, alert  and oriented  Airway and Oxygen Therapy: Patient Spontanous Breathing  Post-op Pain: none  Post-op Assessment: Post-op Vital signs reviewed  Post-op Vital Signs: Reviewed  Last Vitals:  Filed Vitals:   08/06/14 1838  BP: 135/76  Pulse: 95  Temp: 36.4 C  Resp: 18    Complications: No apparent anesthesia complications

## 2014-08-06 NOTE — Anesthesia Preprocedure Evaluation (Addendum)
Anesthesia Evaluation  Patient identified by MRN, date of birth, ID band Patient awake    Reviewed: Allergy & Precautions, H&P , NPO status , Patient's Chart, lab work & pertinent test results  Airway Mallampati: I  TM Distance: >3 FB Neck ROM: Full    Dental  (+) Teeth Intact, Dental Advisory Given   Pulmonary former smoker,  breath sounds clear to auscultation        Cardiovascular negative cardio ROS  Rhythm:Regular Rate:Normal     Neuro/Psych Depression Lumbar stenosis    GI/Hepatic Neg liver ROS, GERD-  ,  Endo/Other  diabetes, Type 2Hypothyroidism   Renal/GU negative Renal ROS     Musculoskeletal negative musculoskeletal ROS (+)   Abdominal   Peds  Hematology negative hematology ROS (+)   Anesthesia Other Findings   Reproductive/Obstetrics                           Anesthesia Physical Anesthesia Plan  ASA: II  Anesthesia Plan: General   Post-op Pain Management:    Induction: Intravenous  Airway Management Planned: Oral ETT  Additional Equipment:   Intra-op Plan:   Post-operative Plan: Extubation in OR  Informed Consent: I have reviewed the patients History and Physical, chart, labs and discussed the procedure including the risks, benefits and alternatives for the proposed anesthesia with the patient or authorized representative who has indicated his/her understanding and acceptance.     Plan Discussed with: CRNA and Surgeon  Anesthesia Plan Comments:         Anesthesia Quick Evaluation

## 2014-08-06 NOTE — Progress Notes (Signed)
Pt and family c/o that the room is way to small and they were told that they would be in a "new big" room in the [place] tower. Explained that this unit was for back surgery Pt's and this is where Dr. Hal Neer would like for you to be. Pt and family requested to be moved to the tower. Report called to RN on 5N and Pt was transferred to 5N09. Holli Humbles, RN

## 2014-08-06 NOTE — Op Note (Signed)
Preoperative diagnosis: Lumbar scoliosis with degenerative disc disease and lumbar stenosis Postop diagnosis: Same Procedure bilateral L4-5 decompressive laminectomy for relief of central and lateral recess stenosis more so than needed for interbody fusion Bilateral L4-5 microdiscectomy Bilateral L4-5 posterior lumbar interbody fusion with peek interbody spacer L4-5 posterior lateral fusion Nonsegmental instrumentation L4-5 with Pathfinder percutaneous pedicle screw system Surgeon: Law Corsino Asst.:Botero  After being placed the prone position the patient's back was prepped and draped in the usual sterile fashion. Localizing x-ray was taken prior to incision to identify the appropriate level. Midline incision was made above the spinous processes of L4 and L5. The incision was carried down to the lumbar dorsal fascia which was separated from the more superficial subcutaneous tissue. We then did a subperiosteal dissection along the spinous processes and lamina facet joint and self retaining retractor was placed for exposure. X-ray showed approach the appropriate level. Using the Leksell rongeur spinous process of L4 and L5 were removed. Starting the patient's left side generous laminotomy was performed by removing the inferior 80% of the L4 lamina the medial three quarters of the facet joint and the superior one third of the L5 lamina. Residual bone and ligamentum flavum removed in a piecemeal fashion. We then removed the residual midline structures to complete the bilateral decompressive laminectomy and relieve the central and lateral recess stenosis. We then entered the disc space with a 15 blade. It was markedly collapsed so we did sequential distraction until we had an 8 mm size. We then thoroughly cleaned out the disc space with a variety of instruments. We made sure the endplates were cleaned for interbody fusion. We then filled to 8 x 9 x 26 mm cages with a mixture of autologous bone and morselized  allograft. We impacted the cages without difficulty and also place the same mixture deep within the interspace to help with interbody fusion. We then decorticated the residual facet joint placed a mixture of autologous bone and morselized allograft for posterolateral fusion. We then irrigated copiously controlled any bleeding with upper coagulation Gelfoam. We close lumbar dorsal fashion the midline and placed percutaneous pedicle screws at L4-5 bilaterally in standard fashion. We passed Jamshidi needles replaced it with wires 100 good position which was confirmed in AP lateral fluoroscopy. We then incised the fascia between the wires bilaterally. We tapped with a 6 mm tap and then placed 6.5 x 40 mm screws bilaterally at L4 and L5. We then incised the fascia between the 2 towers bilaterally past rods down without difficulty. We placed a top loading nuts and did tightening and final tightening with torque and counter torque. We then removed the [facility] and final fluoroscopy in AP lateral direction looked excellent. We irrigated these wounds once more and closed the fascia above R small screw openings. We then closed the wound in multiple layers of Vicryl on the subcutaneous subcutaneous tic or tissue. We then did a running locking Prolene on the skin. A sterile dressing was then applied and the patient was extubated and taken to recovery in stable condition.

## 2014-08-06 NOTE — H&P (Signed)
April Obrien is an 53 y.o. female.   Chief Complaint: back pain into the legs  HPI: the patient is a 53 year old female who is evaluated in the office for back pain with rates in the legs which is equal bilaterally. She's had this problem for a year and has been quite severe over the last 3-4 months. There is no inciting event. She was followed by an orthopedist for many months tried epidural shots as well as medications and pain medication. She did physical therapy without relief. An MRI scan and a myelogram post- CT were arranged. Patient preferred that a neurosurgeon handwork care and requested that we do any surgery if needed. Her films were reviewed and it was felt that her primary issue was at L4-5. She did have some mild collapse at L2-3 was not felt to be clinically significant and we therefore chose to strictly address L4-5. Because of the marked collapse and scoliosis was elected to do a decompression with interbody fusion and instrumentation. I've had a long discussion with her regarding the risks and benefits of surgical intervention. The risks discussed include but are not limited to bleeding infection weakness numbness paralysis spinal fluid leak trouble with instrumentation nonunion coma and death. We have discussed alternative methods of therapy along with the risks and benefits of nonintervention. She's had the opportunity to ask numerous questions and appears to understand. With this information in hand she has requested we proceed with surgery.  Past Medical History  Diagnosis Date  . GERD (gastroesophageal reflux disease)   . Chronic back pain   . Depression   . Diabetes mellitus without complication   . Hypothyroidism   . Hypercholesterolemia   . Wears glasses   . Hepatitis     PMH: Hep A    Past Surgical History  Procedure Laterality Date  . Colonoscopy    . Hernia repair    . Knee surgery      arthroscopy on right knee  . Elbow      right lateral epicondylitis   . Shoulder arthroscopy      right     Family History  Problem Relation Age of Onset  . Diabetes Father   . Heart disease Father    Social History:  reports that she quit smoking about 6 weeks ago. Her smoking use included Cigarettes. She started smoking about 39 years ago. She has a 26 pack-year smoking history. She has never used smokeless tobacco. She reports that she drinks alcohol. She reports that she does not use illicit drugs.  Allergies: No Known Allergies  Medications Prior to Admission  Medication Sig Dispense Refill  . Ascorbic Acid (VITAMIN C PO) Take 1 tablet by mouth daily.    . Canagliflozin-Metformin HCl (INVOKAMET) [phone] MG TABS Take by mouth 2 (two) times daily.    . celecoxib (CELEBREX) 200 MG capsule Take 200 mg by mouth daily.    . cholecalciferol (VITAMIN D) 1000 UNITS tablet Take 1,000 Units by mouth daily.    . CHROMIUM PO Take 1 tablet by mouth daily.    Marland Kitchen co-enzyme Q-10 30 MG capsule Take 30 mg by mouth daily.    . cyclobenzaprine (FLEXERIL) 5 MG tablet Take 5 mg by mouth as needed for muscle spasms.    Marland Kitchen FLUoxetine (PROZAC) 20 MG capsule Take 20-40 mg by mouth daily. 20 mg one day and 40 mg the other day and repeat    . levothyroxine (SYNTHROID, LEVOTHROID) 112 MCG tablet Take 112 mcg by  mouth daily before breakfast.    . Multiple Vitamin (MULTIVITAMIN WITH MINERALS) TABS tablet Take 1 tablet by mouth daily.    . Omega-3 Fatty Acids (FISH OIL PO) Take 2 capsules by mouth daily.    Marland Kitchen omeprazole (PRILOSEC) 40 MG capsule Take 40 mg by mouth daily.    . simvastatin (ZOCOR) 40 MG tablet Take 40 mg by mouth daily.    Marland Kitchen oxyCODONE-acetaminophen (PERCOCET/ROXICET) 5-325 MG per tablet Take by mouth every 6 (six) hours as needed for severe pain.      Results for orders placed or performed during the hospital encounter of 08/06/14 (from the past 48 hour(s))  Glucose, capillary     Status: Abnormal   Collection Time: 08/06/14 10:28 AM  Result Value Ref Range    Glucose-Capillary 158 (H) 70 - 99 mg/dL   No results found.  positive for some balance disturbance as well as high cholesterol  Blood pressure 132/87, pulse 99, temperature 98.1 F (36.7 C), temperature source Oral, resp. rate 20, height 5\' 4"  (1.626 m), weight 77.429 kg (170 lb 11.2 oz), SpO2 99 %.  The patient is awake alert and oriented. She has no facial asymmetry. Her strength is 5 over 5 in all muscle groups tested. She has 1+ knee jerks absent ankle jerk reflexes. Sensation is intact to light touch Assessment/Plan Impression is that of scoliosis and marked degenerative disease at L4-5. The plan is for an L4-5 decompression with fusion and instrumentation.  Faythe Ghee, MD 08/06/2014, 12:01 PM

## 2014-08-06 NOTE — Transfer of Care (Signed)
Immediate Anesthesia Transfer of Care Note  Patient: April Obrien  Procedure(s) Performed: Procedure(s) with comments: POSTERIOR LUMBAR FUSION 1 LEVEL (N/A) - POSTERIOR LUMBAR FUSION 1 LEVEL LUMBAR 4-5  Patient Location: PACU  Anesthesia Type:General  Level of Consciousness: sedated  Airway & Oxygen Therapy: Patient Spontanous Breathing and Patient connected to nasal cannula oxygen  Post-op Assessment: Report given to PACU RN, Post -op Vital signs reviewed and stable and Patient moving all extremities  Post vital signs: Reviewed and stable  Complications: No apparent anesthesia complications

## 2014-08-07 LAB — GLUCOSE, CAPILLARY: Glucose-Capillary: 209 mg/dL — ABNORMAL HIGH (ref 70–99)

## 2014-08-07 MED ORDER — CYCLOBENZAPRINE HCL 10 MG PO TABS: 10.0000 mg | ORAL_TABLET | Freq: Three times a day (TID) | ORAL | Status: DC | PRN

## 2014-08-07 MED ORDER — HYDROMORPHONE HCL 4 MG PO TABS: 4.0000 mg | ORAL_TABLET | ORAL | Status: DC | PRN

## 2014-08-07 NOTE — Discharge Summary (Signed)
  Physician Discharge Summary  Patient ID: April Obrien MRN: [id number] DOB/AGE: [birth year] 53 y.o.  Admit date: 08/06/2014 Discharge date: 08/07/2014  Admission Diagnoses:  Discharge Diagnoses:  Active Problems:   Lumbar spinal stenosis   Discharged Condition: good  Hospital Course: Surgery yesterday for L 45 plif. Did well. Only incisional pain post op. Wound healing well. Ambulated without difficulty. Home post op day 1. Specific instructions given.  Consults: None  Significant Diagnostic Studies: None  Treatments: surgery: L 45 plif with pedicle screws  Discharge Exam: Blood pressure 118/75, pulse 86, temperature 97.9 F (36.6 C), temperature source Oral, resp. rate 16, height 5\' 4"  (1.626 m), weight 77.429 kg (170 lb 11.2 oz), SpO2 97 %. Incision/Wound:clean and dry; no new neuro issues  Disposition: Final discharge disposition not confirmed     Medication List    ASK your doctor about these medications        celecoxib 200 MG capsule  Commonly known as:  CELEBREX  Take 200 mg by mouth daily.     cholecalciferol 1000 UNITS tablet  Commonly known as:  VITAMIN D  Take 1,000 Units by mouth daily.     CHROMIUM PO  Take 1 tablet by mouth daily.     co-enzyme Q-10 30 MG capsule  Take 30 mg by mouth daily.     FISH OIL PO  Take 2 capsules by mouth daily.     FLEXERIL 5 MG tablet  Generic drug:  cyclobenzaprine  Take 5 mg by mouth as needed for muscle spasms.     INVOKAMET [phone] MG Tabs  Generic drug:  Canagliflozin-Metformin HCl  Take by mouth 2 (two) times daily.     levothyroxine 112 MCG tablet  Commonly known as:  SYNTHROID, LEVOTHROID  Take 112 mcg by mouth daily before breakfast.     multivitamin with minerals Tabs tablet  Take 1 tablet by mouth daily.     oxyCODONE-acetaminophen 5-325 MG per tablet  Commonly known as:  PERCOCET/ROXICET  Take by mouth every 6 (six) hours as needed for severe pain.     PRILOSEC 40 MG capsule  Generic  drug:  omeprazole  Take 40 mg by mouth daily.     PROZAC 20 MG capsule  Generic drug:  FLUoxetine  Take 20-40 mg by mouth daily. 20 mg one day and 40 mg the other day and repeat     simvastatin 40 MG tablet  Commonly known as:  ZOCOR  Take 40 mg by mouth daily.     VITAMIN C PO  Take 1 tablet by mouth daily.         At home rest most of the time. Get up 9 or 10 times each day and take a 15 or 20 minute walk. No riding in the car and to your first postoperative appointment. If you have neck surgery you may shower from the chest down starting on the third postoperative day. If you had back surgery he may start showering on the third postoperative day with saran wrap wrapped around your incisional area 3 times. After the shower remove the saran wrap. Take pain medicine as needed and other medications as instructed. Call my office for an appointment.  SignedFaythe Ghee, MD 08/07/2014, 9:03 AM

## 2014-08-07 NOTE — Plan of Care (Signed)
Problem: Consults Goal: Spinal Surgery Patient Education See Patient Education Module for education specifics.  Outcome: Completed/Met Date Met:  08/07/14 Goal: Diagnosis - Spinal Surgery Thoraco/Lumbar Spine Fusion L4-L5  Problem: Phase I Progression Outcomes Goal: Log roll for position change Outcome: Completed/Met Date Met:  08/07/14

## 2014-08-07 NOTE — Progress Notes (Signed)
Utilization review completed.  

## 2014-08-07 NOTE — Plan of Care (Signed)
Problem: Phase I Progression Outcomes Goal: OOB as tolerated unless otherwise ordered Outcome: Completed/Met Date Met:  08/07/14

## 2014-08-07 NOTE — Plan of Care (Signed)
Problem: Phase II Progression Outcomes Goal: Tolerating diet Outcome: Completed/Met Date Met:  08/07/14     

## 2014-11-19 ENCOUNTER — Ambulatory Visit (HOSPITAL_BASED_OUTPATIENT_CLINIC_OR_DEPARTMENT_OTHER): Payer: BLUE CROSS/BLUE SHIELD | Admitting: Lab

## 2014-11-19 ENCOUNTER — Encounter: Payer: Self-pay | Admitting: Hematology & Oncology

## 2014-11-19 ENCOUNTER — Ambulatory Visit (HOSPITAL_BASED_OUTPATIENT_CLINIC_OR_DEPARTMENT_OTHER): Payer: BLUE CROSS/BLUE SHIELD | Admitting: Hematology & Oncology

## 2014-11-19 VITALS — BP 123/83 | HR 93 | Temp 98.9°F | Resp 14 | Ht 64.0 in | Wt 174.0 lb

## 2014-11-19 DIAGNOSIS — D472 Monoclonal gammopathy: Secondary | ICD-10-CM

## 2014-11-19 LAB — CBC WITH DIFFERENTIAL (CANCER CENTER ONLY)
BASO#: 0.1 10*3/uL (ref 0.0–0.2)
BASO%: 0.8 % (ref 0.0–2.0)
EOS%: 2 % (ref 0.0–7.0)
Eosinophils Absolute: 0.2 10*3/uL (ref 0.0–0.5)
HCT: 43.2 % (ref 34.8–46.6)
HGB: 14.6 g/dL (ref 11.6–15.9)
LYMPH#: 2.2 10*3/uL (ref 0.9–3.3)
LYMPH%: 26.5 % (ref 14.0–48.0)
MCH: 30.1 pg (ref 26.0–34.0)
MCHC: 33.8 g/dL (ref 32.0–36.0)
MCV: 89 fL (ref 81–101)
MONO#: 0.6 10*3/uL (ref 0.1–0.9)
MONO%: 6.6 % (ref 0.0–13.0)
NEUT%: 64.1 % (ref 39.6–80.0)
NEUTROS ABS: 5.4 10*3/uL (ref 1.5–6.5)
Platelets: 414 10*3/uL — ABNORMAL HIGH (ref 145–400)
RBC: 4.85 10*6/uL (ref 3.70–5.32)
RDW: 13.2 % (ref 11.1–15.7)
WBC: 8.4 10*3/uL (ref 3.9–10.0)

## 2014-11-19 LAB — CMP (CANCER CENTER ONLY)
ALK PHOS: 66 U/L (ref 26–84)
ALT(SGPT): 19 U/L (ref 10–47)
AST: 23 U/L (ref 11–38)
Albumin: 3.8 g/dL (ref 3.3–5.5)
BUN: 13 mg/dL (ref 7–22)
CALCIUM: 9.4 mg/dL (ref 8.0–10.3)
CO2: 26 meq/L (ref 18–33)
CREATININE: 0.8 mg/dL (ref 0.6–1.2)
Chloride: 97 mEq/L — ABNORMAL LOW (ref 98–108)
GLUCOSE: 181 mg/dL — AB (ref 73–118)
POTASSIUM: 4 meq/L (ref 3.3–4.7)
Sodium: 142 mEq/L (ref 128–145)
Total Bilirubin: 0.7 mg/dl (ref 0.20–1.60)
Total Protein: 7.7 g/dL (ref 6.4–8.1)

## 2014-11-19 NOTE — Progress Notes (Signed)
Hematology and Oncology Follow Up Visit  ISABELLY KOBLER [id number] 05/21/1961 54 y.o. 11/19/2014   Principle Diagnosis:   IgA Kappa MGUS  Current Therapy:    Observation     Interim History:  Ms.  Galano is back for follow-up. She had her back surgery. She went through this very nicely. She had this done back in November. She had a fusion at L4-5. She does feel a lot better.  We first saw her, there is no evidence that she had myeloma. We repeated her SPEP. We really cannot find a monoclonal spike.  We did a 24-hour urine on her. There is no light chains in her urine that were monoclonal.  Again, she is feeling better. She's not hurting like she was before. This is a true blessing.  She's had no problems with fever. There's been no problems with nausea vomiting. She's had a problem with bowels or bladder.  Medications:  Current outpatient prescriptions:  .  Ascorbic Acid (VITAMIN C PO), Take 1 tablet by mouth daily., Disp: , Rfl:  .  Canagliflozin-Metformin HCl (INVOKAMET) [phone] MG TABS, Take by mouth 2 (two) times daily., Disp: , Rfl:  .  cholecalciferol (VITAMIN D) 1000 UNITS tablet, Take 1,000 Units by mouth daily., Disp: , Rfl:  .  CHROMIUM PO, Take 1 tablet by mouth daily., Disp: , Rfl:  .  co-enzyme Q-10 30 MG capsule, Take 30 mg by mouth daily., Disp: , Rfl:  .  cyclobenzaprine (FLEXERIL) 10 MG tablet, Take 1 tablet (10 mg total) by mouth 3 (three) times daily as needed for muscle spasms., Disp: 30 tablet, Rfl: 0 .  FLUoxetine (PROZAC) 20 MG capsule, Take 20-40 mg by mouth daily. 20 mg one day and 40 mg the other day and repeat, Disp: , Rfl:  .  levothyroxine (SYNTHROID, LEVOTHROID) 112 MCG tablet, Take 112 mcg by mouth daily before breakfast., Disp: , Rfl:  .  Multiple Vitamin (MULTIVITAMIN WITH MINERALS) TABS tablet, Take 1 tablet by mouth daily., Disp: , Rfl:  .  Omega-3 Fatty Acids (FISH OIL PO), Take 2 capsules by mouth daily., Disp: , Rfl:  .  omeprazole  (PRILOSEC) 40 MG capsule, Take 40 mg by mouth daily., Disp: , Rfl:  .  simvastatin (ZOCOR) 40 MG tablet, Take 40 mg by mouth daily., Disp: , Rfl:  .  HYDROmorphone (DILAUDID) 4 MG tablet, Take 1 tablet (4 mg total) by mouth every 4 (four) hours as needed for severe pain. (Patient not taking: Reported on 11/19/2014), Disp: 65 tablet, Rfl: 0  Allergies: No Known Allergies  Past Medical History, Surgical history, Social history, and Family History were reviewed and updated.  Review of Systems: As above  Physical Exam:  height is 5\' 4"  (1.626 m) and weight is 174 lb (78.926 kg). Her oral temperature is 98.9 F (37.2 C). Her blood pressure is 123/83 and her pulse is 93. Her respiration is 14.   Well developed and well nourished white female in no obvious distress. Head and neck exam shows no ocular or oral lesions. She has no palpable cervical or supraclavicular lymph nodes. Lungs are clear. Cardiac exam regular rate and rhythm with no murmurs, rubs or bruits. Abdominal exam shows a soft abdomen with good bowel soundsshe has no fluid wave. There is no palpable liver or spleen tip. Back exam shows a laminectomy scar in the lumbar spine which is well-healed. No tenderness is noted over the back. Extremities shows no clubbing, cyanosis or edema. Skin exam shows no  rashes, ecchymoses or petechia.  Lab Results  Component Value Date   WBC 8.4 11/19/2014   HGB 14.6 11/19/2014   HCT 43.2 11/19/2014   MCV 89 11/19/2014   PLT 414* 11/19/2014     Chemistry      Component Value Date/Time   NA 139 08/04/2014 1516   NA 139 05/21/2014 1042   K 4.1 08/04/2014 1516   K 3.7 05/21/2014 1042   CL 99 08/04/2014 1516   CL 96* 05/21/2014 1042   CO2 20 08/04/2014 1516   CO2 28 05/21/2014 1042   BUN 21 08/04/2014 1516   BUN 13 05/21/2014 1042   CREATININE 0.73 08/04/2014 1516   CREATININE 0.5* 05/21/2014 1042      Component Value Date/Time   CALCIUM 10.5 08/04/2014 1516   CALCIUM 9.8 05/21/2014 1042    ALKPHOS 54 08/04/2014 1516   ALKPHOS 55 05/21/2014 1042   AST 13 08/04/2014 1516   AST 18 05/21/2014 1042   ALT 13 08/04/2014 1516   ALT 21 05/21/2014 1042   BILITOT 0.3 08/04/2014 1516   BILITOT 0.70 05/21/2014 1042         Impression and Plan: Ms. Twining is 54 year old white female. She had a minimal IgG Kappa MGUS. Again, we saw her, we cannot identify a monoclonal spike.  I do not believe she has any evidence of a hematologic disorder. I think any MGUS that she might have is totally reactive.  At this point in time, I don't think we have to get her back to be seen. She is doing well. I don't believe she needs a bone marrow test. I do not believe she needs any x-ray studies.  She comes in with her mom. I spent a good 30 minutes with them. I reviewed all of her lab work and explained what I thought was going on. She is happy that she does not have to come back.  I told her to make sure she keeps getting her mammograms.   Volanda Napoleon, MD 2/25/201612:12 PM

## 2014-11-23 LAB — PROTEIN ELECTROPHORESIS, SERUM, WITH REFLEX
ALBUMIN ELP: 56.1 % (ref 55.8–66.1)
ALPHA-1-GLOBULIN: 4.6 % (ref 2.9–4.9)
Alpha-2-Globulin: 11.9 % — ABNORMAL HIGH (ref 7.1–11.8)
BETA 2: 10.8 % — AB (ref 3.2–6.5)
Beta Globulin: 6.5 % (ref 4.7–7.2)
GAMMA GLOBULIN: 10.1 % — AB (ref 11.1–18.8)
M-Spike, %: 0.26 g/dL
Total Protein, Serum Electrophoresis: 7.4 g/dL (ref 6.0–8.3)

## 2014-11-23 LAB — IGG, IGA, IGM
IGA: 476 mg/dL — AB (ref 69–380)
IgG (Immunoglobin G), Serum: 1210 mg/dL (ref 690–1700)
IgM, Serum: 43 mg/dL — ABNORMAL LOW (ref 52–322)

## 2014-11-23 LAB — KAPPA/LAMBDA LIGHT CHAINS
Kappa free light chain: 1.94 mg/dL (ref 0.33–1.94)
Kappa:Lambda Ratio: 1.1 (ref 0.26–1.65)
Lambda Free Lght Chn: 1.76 mg/dL (ref 0.57–2.63)

## 2014-11-23 LAB — BETA 2 MICROGLOBULIN, SERUM: Beta-2 Microglobulin: 2.66 mg/L — ABNORMAL HIGH (ref <=2.51)

## 2014-11-23 LAB — IFE INTERPRETATION

## 2015-03-24 ENCOUNTER — Other Ambulatory Visit: Payer: Self-pay | Admitting: Neurosurgery

## 2015-03-24 DIAGNOSIS — M48061 Spinal stenosis, lumbar region without neurogenic claudication: Secondary | ICD-10-CM

## 2015-04-07 ENCOUNTER — Ambulatory Visit
Admission: RE | Admit: 2015-04-07 | Discharge: 2015-04-07 | Disposition: A | Discharge status: Home / Self Care | Payer: BLUE CROSS/BLUE SHIELD | Source: Ambulatory Visit | Attending: Neurosurgery | Admitting: Neurosurgery

## 2015-04-07 DIAGNOSIS — M48061 Spinal stenosis, lumbar region without neurogenic claudication: Secondary | ICD-10-CM

## 2015-04-07 MED ORDER — GADOBENATE DIMEGLUMINE 529 MG/ML IV SOLN
16.0000 mL | Freq: Once | INTRAVENOUS | Status: AC | PRN
  Administered 2015-04-07: 16 mL via INTRAVENOUS

## 2015-04-19 IMAGING — CR DG MYELOGRAPHY LUMBAR INJ LUMBOSACRAL
13 of 17 series · 13 of 17 positions shown · non-contrast
Comparison: MRI lumbar spine 03/22/2014.

CLINICAL DATA: Right greater than left lower extremity pain and
weakness.
TECHNIQUE: Contiguous axial images were obtained through the Lumbar spine after
the intrathecal infusion of infusion. Coronal and sagittal
reconstructions were obtained of the axial image sets.

[[hospital]]
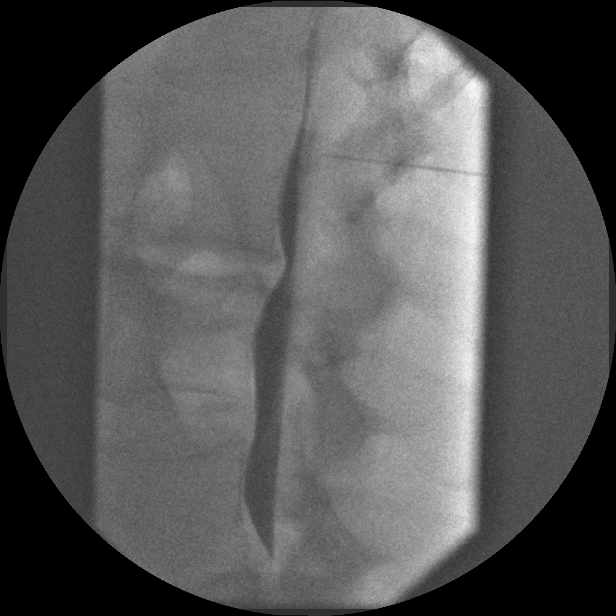

[myelogram  white (1 of 10)]
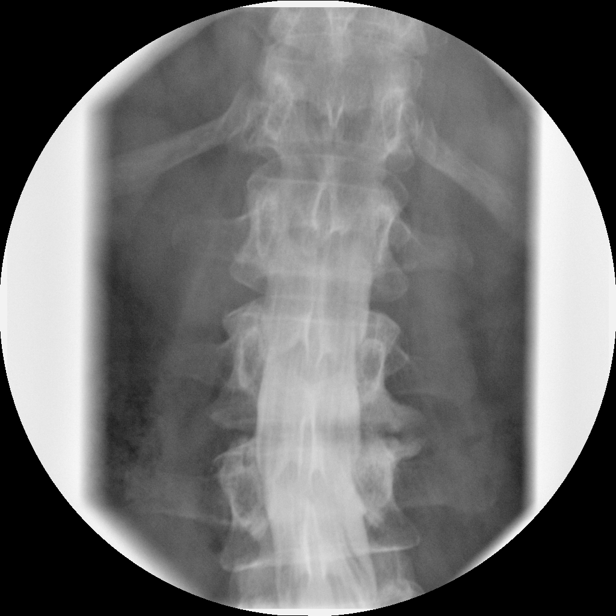

[myelogram  white (2 of 10)]
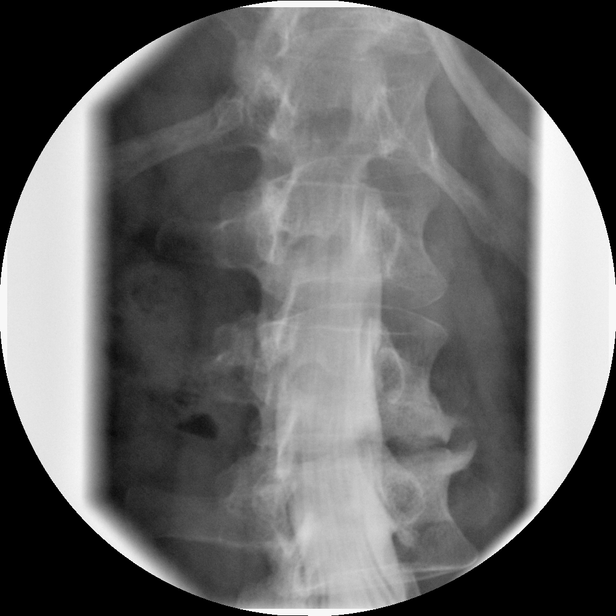

[myelogram  white (3 of 10)]
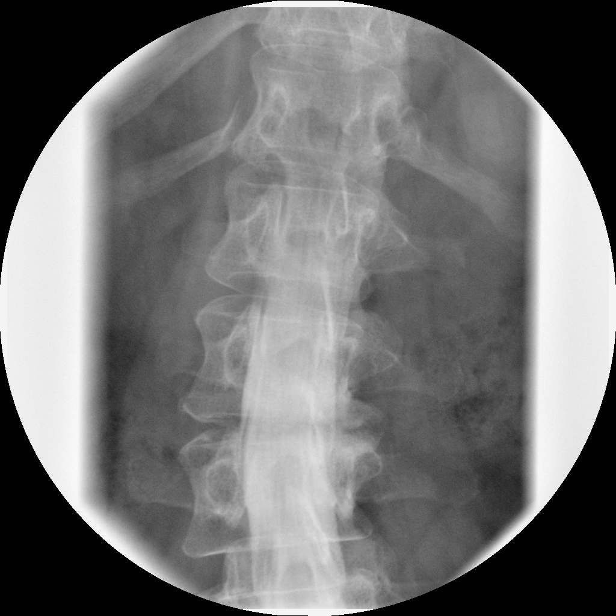

[myelogram  white (4 of 10)]
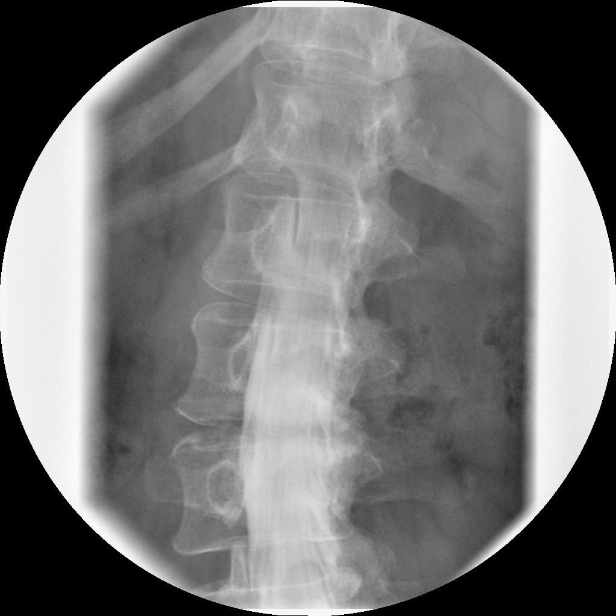

[myelogram  white (5 of 10)]
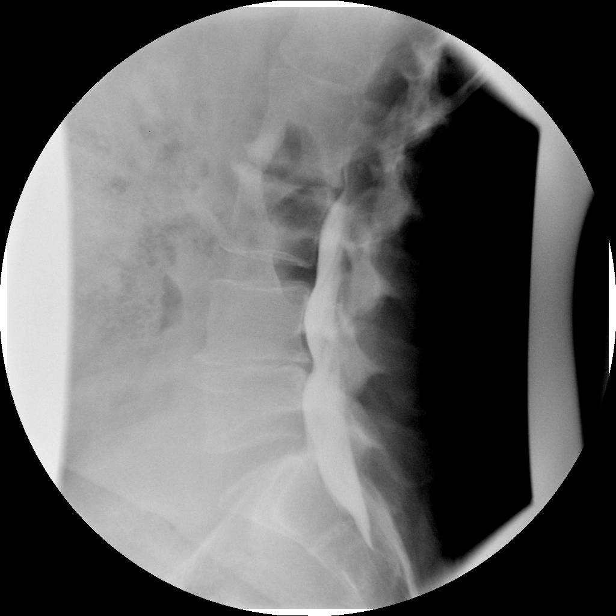

[myelogram  white (6 of 10)]
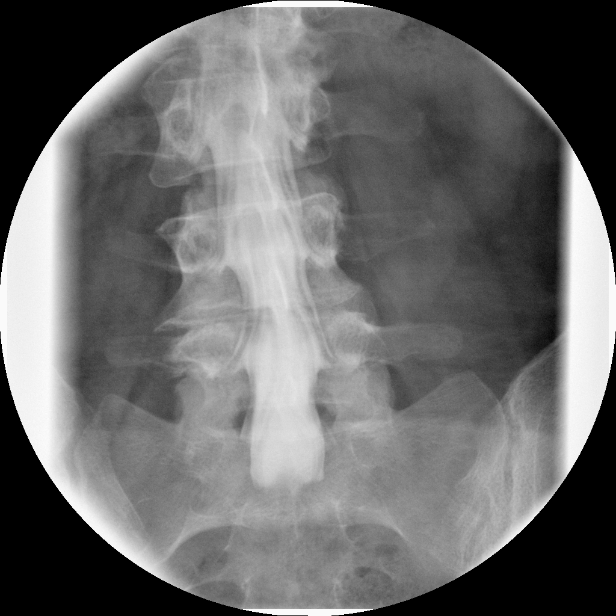

[myelogram  white (7 of 10)]
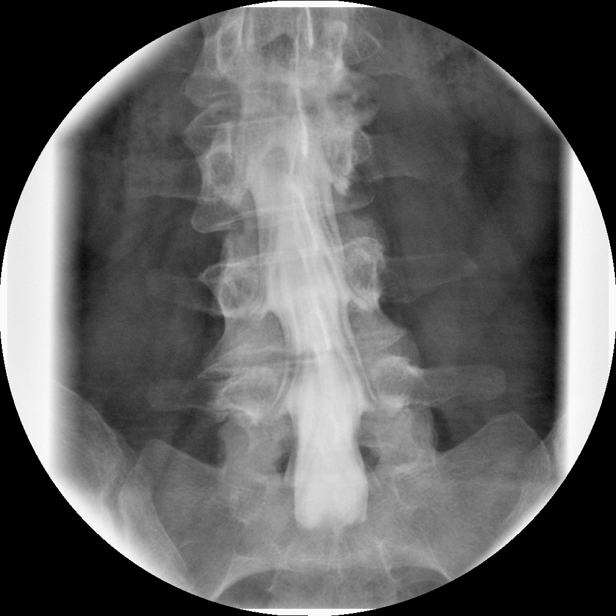

[myelogram  white (8 of 10)]
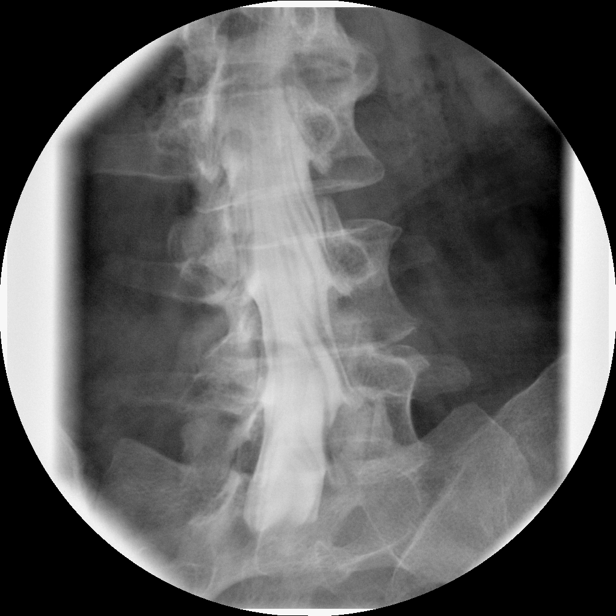

[myelogram  white (9 of 10)]
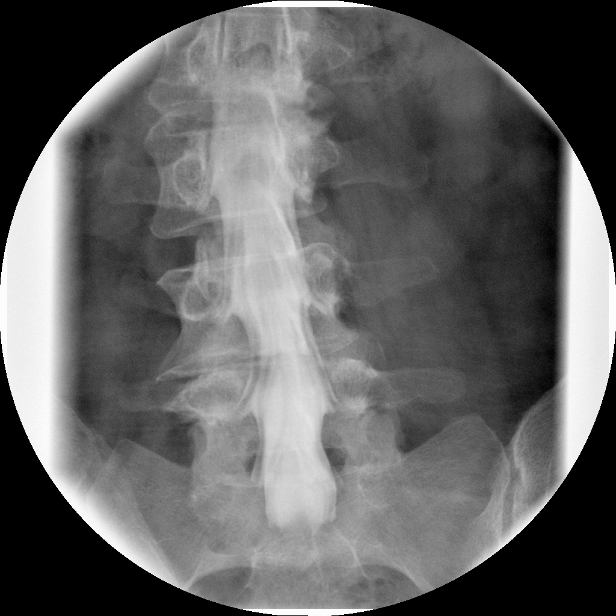

[myelogram  white (10 of 10)]
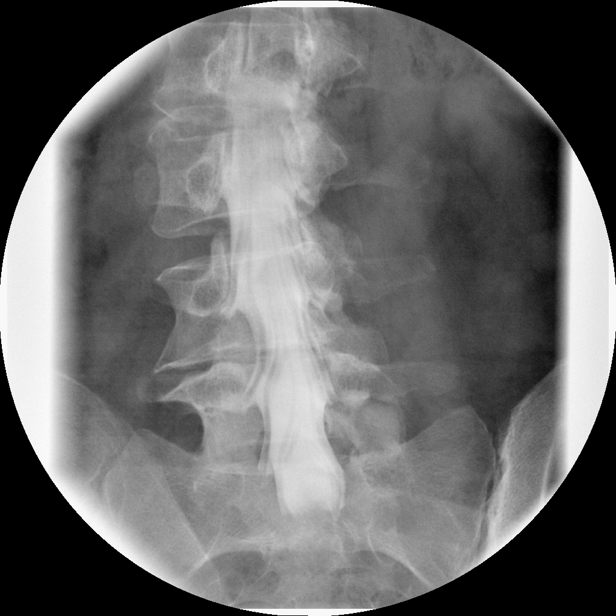

[view not recorded (1 of 2)]
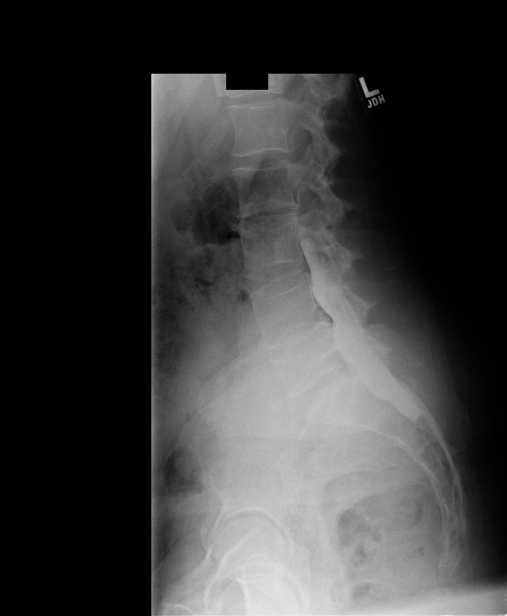

[view not recorded (2 of 2)]
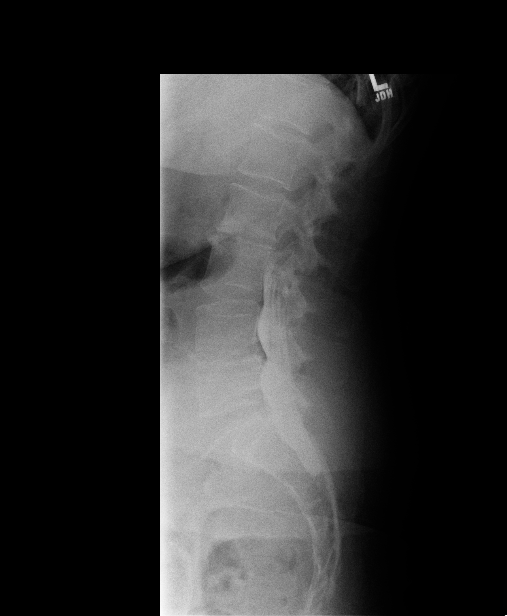

[13 of 17 positions shown; findings below may reference images not displayed]

EXAM:
LUMBAR MYELOGRAM

FLUOROSCOPY TIME:  40 seconds

PROCEDURE:
After thorough discussion of risks and benefits of the procedure
including bleeding, infection, injury to nerves, blood vessels,
adjacent structures as well as headache and CSF leak, written and
oral informed consent was obtained. Consent was obtained by Dr.
Daniel Bernardo Bahamon. Time out form was completed.

Patient was positioned prone on the fluoroscopy table. Local
anesthesia was provided with 1% lidocaine without epinephrine after
prepped and draped in the usual sterile fashion. Puncture was
performed at L1-2 using a 3 1/2 inch 22-gauge spinal needle via left
paramedian approach. Using a single pass through the dura, the
needle was placed within the thecal sac, with return of clear CSF.
15 mL of Bmnipaque-FK3 was injected into the thecal sac, with normal
opacification of the nerve roots and cauda equina consistent with
free flow within the subarachnoid space.

I personally performed the lumbar puncture and administered the
intrathecal contrast. I also personally supervised acquisition of
the myelogram images.
FINDINGS: LUMBAR MYELOGRAM FINDINGS:

Leftward curvature of the lumbar spine is evident that L2-3.
Compensatory rightward curvature is present at L5. Mild right-sided
subarticular narrowing is present at L2-3. The nerve roots otherwise
opacify normally in the upper lumbar spine. There is chronic loss of
disc height and endplate change at L2-3.

A broad-based disc protrusion is present at L4-5. Mild subarticular
narrowing is present on the left at L4-5. The nerve roots otherwise
fill normally.

The disc protrusion at L4-5 is stable upon standing. Alignment is
stable is well. There is no significant change during flexion or
extension.

CT LUMBAR MYELOGRAM FINDINGS:

The lumbar spine is imaged from the midbody of T12 through S2 3. The
conus medullaris terminates at L1, within normal limits. Leftward
curvature of the lumbar spine centered at L2-3. Compensatory
rightward curvature is present at L5.

Limited imaging of the abdomen is unremarkable.

L1-2:  Negative.

L2-3: Mild right subarticular narrowing is present. There is some
disc protrusion into the right foramen as well without significant
stenosis.

L3-4: Slight disc bulging and facet hypertrophy is present without
significant stenosis.

L4-5: A leftward disc protrusion is again seen. There is a vacuum
disc. Mild left subarticular and foraminal stenosis is present.
Minimal right foraminal narrowing is present as well. The right
central canal is patent.

L5-S1: Mild facet hypertrophy is worse on the left. There is no
significant stenosis.
IMPRESSION: LUMBAR MYELOGRAM IMPRESSION:

1. Broad-based disc protrusions at L2-3 and L4-5 associated with
scoliosis.
2. Sub articular narrowing is present on the right at L2-3.
3. Subarticular narrowing is present on the left at L4-5.
4. There is no significant change in the disc protrusions or
stenosis with standing, flexion, or extension.

CT LUMBAR MYELOGRAM IMPRESSION:

1. Leftward curvature of the lumbar spine at L2-3 with associated
mild right subarticular stenosis secondary to a disc protrusion and
endplate degenerative change.
2. Slight disc protrusion into the right foramen at L2-3 without
significant foraminal stenosis.
3. Mild disc bulging and facet hypertrophy at L3-4 and L5-S1 without
significant stenosis at these levels.
4. Leftward disc protrusion at L4-5 with mild left subarticular and
foraminal stenosis.
5. Mild right foraminal narrowing at L4-5.

## 2015-08-12 ENCOUNTER — Other Ambulatory Visit: Payer: Self-pay | Admitting: Orthopaedic Surgery

## 2015-08-12 DIAGNOSIS — M79671 Pain in right foot: Secondary | ICD-10-CM

## 2015-08-16 ENCOUNTER — Ambulatory Visit
Admission: RE | Admit: 2015-08-16 | Discharge: 2015-08-16 | Disposition: A | Discharge status: Home / Self Care | Payer: No Typology Code available for payment source | Source: Ambulatory Visit | Attending: Orthopaedic Surgery | Admitting: Orthopaedic Surgery

## 2015-08-16 DIAGNOSIS — M79671 Pain in right foot: Secondary | ICD-10-CM

## 2015-08-25 ENCOUNTER — Other Ambulatory Visit: Payer: BLUE CROSS/BLUE SHIELD

## 2015-12-28 ENCOUNTER — Other Ambulatory Visit: Payer: Self-pay | Admitting: Orthopaedic Surgery

## 2015-12-28 DIAGNOSIS — M25511 Pain in right shoulder: Secondary | ICD-10-CM

## 2016-01-03 ENCOUNTER — Ambulatory Visit
Admission: RE | Admit: 2016-01-03 | Discharge: 2016-01-03 | Disposition: A | Discharge status: Home / Self Care | Payer: Self-pay | Source: Ambulatory Visit | Attending: Orthopaedic Surgery | Admitting: Orthopaedic Surgery

## 2016-01-03 DIAGNOSIS — M25511 Pain in right shoulder: Secondary | ICD-10-CM

## 2016-11-06 ENCOUNTER — Ambulatory Visit (INDEPENDENT_AMBULATORY_CARE_PROVIDER_SITE_OTHER): Payer: Self-pay | Admitting: Orthopaedic Surgery

## 2016-11-10 ENCOUNTER — Ambulatory Visit (INDEPENDENT_AMBULATORY_CARE_PROVIDER_SITE_OTHER): Payer: Medicaid Other

## 2016-11-10 ENCOUNTER — Ambulatory Visit (INDEPENDENT_AMBULATORY_CARE_PROVIDER_SITE_OTHER): Payer: Medicaid Other | Admitting: Orthopaedic Surgery

## 2016-11-10 ENCOUNTER — Other Ambulatory Visit (INDEPENDENT_AMBULATORY_CARE_PROVIDER_SITE_OTHER): Payer: Self-pay

## 2016-11-10 ENCOUNTER — Telehealth (INDEPENDENT_AMBULATORY_CARE_PROVIDER_SITE_OTHER): Payer: Self-pay | Admitting: Orthopaedic Surgery

## 2016-11-10 ENCOUNTER — Encounter (INDEPENDENT_AMBULATORY_CARE_PROVIDER_SITE_OTHER): Payer: Self-pay | Admitting: Orthopaedic Surgery

## 2016-11-10 VITALS — BP 125/77 | HR 106 | Resp 16 | Ht 64.5 in | Wt 160.0 lb

## 2016-11-10 DIAGNOSIS — M25511 Pain in right shoulder: Secondary | ICD-10-CM

## 2016-11-10 DIAGNOSIS — G8929 Other chronic pain: Secondary | ICD-10-CM

## 2016-11-10 DIAGNOSIS — M542 Cervicalgia: Secondary | ICD-10-CM | POA: Diagnosis not present

## 2016-11-10 NOTE — Progress Notes (Signed)
Office Visit Note   Patient: April Obrien           Date of Birth: [birth year]           MRN: [id number] Visit Date: 11/10/2016              Requested by: Deland Pretty, MD [address] [place], [place] [address] PCP: Alma Friendly, MD   Assessment & Plan: Visit Diagnoses:-Chronic right shoulder pain as previously identified. MRI scan from April 2017 demonstrated diffuse supraspinatus tendinosis with a high grade partial intrasubstance tendon tearing in the critical zone. There was no tendon retraction or significant muscular atrophy. She had moderate subacromial and subdeltoid bursitis. 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 She has an appointment to see an ENT on Monday. She also was having difficulty regulating her blood sugars relating to her diabetes mellitus. I did inject the subacromial space with a combination of Xylocaine and Marcaine with considerable relief of her shoulder discomfort. I think the next step would be consideration of an arthroscopic SAD and possibly a mini open rotator cuff tear repair involving the supraspinatus. I will plan to send her to physical therapy and [place] for her cervical spine long discussion regarding surgery. She can simply call the office if she like to schedule at after the other issues are resolved.  Follow-Up Instructions: No Follow-up on file.   Orders:  No orders of the defined types were placed in this encounter.  No orders of the defined types were placed in this encounter.     Procedures: No procedures performed   Clinical Data: No additional findings.   Subjective: Chief Complaint  Patient presents with  . Right Shoulder - Pain, Numbness, Edema    Pt presents  with chronic right shoulder pain. She had an MRI 12/2015, her last visit. Pt is diabetic and states the cortisone injections are not so good for her, but they do work. Pain, numbness, tingling, also neck pain.  April Obrien has had a number of issues lately with her health. She was hospitalized for an elevated blood sugar. She's working "I'm getting it under control. She also had some problems with her teeth acquiring several to be "pulled". She apparently had a problem with her sinus after that that the tooth pulling. She also is been having some trouble with her throat and is having an appointment to see the ENT.  Review of Systems   Objective: Vital Signs: There were no vitals taken for this visit.  Physical Exam  Ortho Exam shoulder exam with some limitation of motion with particularly with internal rotation. Some mild loss of overhead motion consistent with adhesive capsulitis. Positive impingement and positive empty can testing. Skin intact. No pain at the before meals joint. Biceps intact.  Patient motion of cervical spine with inability to touch her chin to her chest lacking about 2 fingerbreadths. Some loss of motion with rotation of the right and the left with particularly pain along the right side of her neck. Some loss of neck extension but without referred pain to either upper extremity.  Specialty Comments:  No specialty comments available.  Imaging: No results found.   PMFS History: Patient Active Problem List   Diagnosis Date Noted  . Lumbar  spinal stenosis 08/06/2014   Past Medical History:  Diagnosis Date  . Chronic back pain   . Depression   . Diabetes mellitus without complication   . GERD (gastroesophageal reflux disease)   . Hepatitis    PMH: Hep A  . Hypercholesterolemia   . Hypothyroidism   . Wears glasses     Family History  Problem Relation Age of Onset  . Diabetes Father   . Heart disease Father     Past Surgical History:  Procedure Laterality Date    . COLONOSCOPY    . elbow     right lateral epicondylitis  . HERNIA REPAIR    . KNEE SURGERY     arthroscopy on right knee  . SHOULDER ARTHROSCOPY     right    Social History   Occupational History  . Not on file.   Social History Main Topics  . Smoking status: Former Smoker    Packs/day: 1.00    Years: 26.00    Types: Cigarettes    Start date: 01/20/1975    Quit date: 06/25/2014  . Smokeless tobacco: Never Used     Comment: currently usig "vape"  . Alcohol use 0.0 oz/week     Comment: rare  . Drug use: No  . Sexual activity: Not on file

## 2016-11-10 NOTE — Telephone Encounter (Signed)
Sent refferal

## 2016-11-10 NOTE — Telephone Encounter (Signed)
Patient called and stated that medicaid does pay for one visit.  She said it is in [place] but couldn't remember the number.  Her Cb#[phone].  Thank you.

## 2016-12-11 ENCOUNTER — Encounter (INDEPENDENT_AMBULATORY_CARE_PROVIDER_SITE_OTHER): Payer: Self-pay | Admitting: Orthopaedic Surgery

## 2016-12-11 ENCOUNTER — Ambulatory Visit (INDEPENDENT_AMBULATORY_CARE_PROVIDER_SITE_OTHER): Payer: Medicaid Other | Admitting: Orthopaedic Surgery

## 2016-12-11 VITALS — BP 140/87 | HR 105 | Resp 16 | Ht 64.0 in | Wt 160.0 lb

## 2016-12-11 DIAGNOSIS — G8929 Other chronic pain: Secondary | ICD-10-CM | POA: Diagnosis not present

## 2016-12-11 DIAGNOSIS — M25511 Pain in right shoulder: Secondary | ICD-10-CM

## 2016-12-11 NOTE — Progress Notes (Signed)
Office Visit Note   Patient: April Obrien           Date of Birth: 16-Jul-1961           MRN: [id number] Visit Date: 12/11/2016              Requested by: Alma Friendly, MD [address] [place], [place] [address] PCP: Alma Friendly, MD   Assessment & Plan: Visit Diagnoses: Chronic right shoulder pain with impingement. MRI scan in April 2017 demonstrated supraspinatus tendinitis   Plan: Long discussion regarding present right shoulder pain. I think the next best step is an arthroscopic subacromial decompression, distal clavicle resection and possible mini open rotator cuff tear repair versus just arthroscopic procedure pending  findings at the time of surgery. April Obrien is aware of  the potential limitations and wishes to proceed. I would like her family physician to provide medical clearance specifically for her diabetes  Follow-Up Instructions: No Follow-up on file.   Orders:  No orders of the defined types were placed in this encounter.  No orders of the defined types were placed in this encounter.     Procedures: No procedures performed   Clinical Data: No additional findings.   Subjective: No chief complaint on file.   April Obrien presents with chronic cervical and right shoulder pain that radiates to her fingers.  She relates the Right cortisone injection provided not relief.  She relates she had 2 teeth pulled and 04/2016 and she has been on antibiotics since then.  She had an xray and it showed there is a "fragment" of bone in the sinus cavity. She will need sinus surgery to fix that issue. Pt is a Type 2 diabetic.  Despite time and exercises April Obrien continues to have a problem with her right shoulder. Prior MRI scan in April of last year demonstrated supraspinatus tendinitis. She also   Has some issues with the cervical spine but does not demonstrate nerve root irritation. April Obrien also was had a problem with her diabetes and is presently on insulin. She needs to  check with her family physician regarding her hemoglobin A1c  Review of Systems   Objective: Vital Signs: There were no vitals taken for this visit.  Physical Exam  Ortho Exam right shoulder exam with positive impingement on the extreme of internal/external rotation. Positive empty can testing. I can slowly and passively place her right arm over her head. She does have some limitation of internal rotation possibly consistent with adhesive capsulitis. No local tenderness with the arm still and at her side. Some limitation of motion of the cervical spine but without any referred pain to either upper extremity  Specialty Comments:  No specialty comments available.  Imaging: No results found.   PMFS History: Patient Active Problem List   Diagnosis Date Noted  . Lumbar spinal stenosis 08/06/2014   Past Medical History:  Diagnosis Date  . Chronic back pain   . Depression   . Diabetes mellitus without complication ([place])   . GERD (gastroesophageal reflux disease)   . Hepatitis    PMH: Hep A  . Hypercholesterolemia   . Hypothyroidism   . Wears glasses     Family History  Problem Relation Age of Onset  . Diabetes Father   . Heart disease Father     Past Surgical History:  Procedure Laterality Date  . COLONOSCOPY    . elbow     right lateral epicondylitis  . HERNIA REPAIR    . KNEE SURGERY  arthroscopy on right knee  . SHOULDER ARTHROSCOPY     right    Social History   Occupational History  . Not on file.   Social History Main Topics  . Smoking status: Former Smoker    Packs/day: 1.00    Years: 26.00    Types: Cigarettes    Start date: 01/20/1975    Quit date: 06/25/2014  . Smokeless tobacco: Never Used     Comment: currently usig "vape"  . Alcohol use 0.0 oz/week     Comment: rare  . Drug use: No  . Sexual activity: Not on file

## 2017-04-04 ENCOUNTER — Telehealth (INDEPENDENT_AMBULATORY_CARE_PROVIDER_SITE_OTHER): Payer: Self-pay | Admitting: Orthopaedic Surgery

## 2017-04-04 NOTE — Telephone Encounter (Signed)
Spoke with pt and told her we have not received the info and gave her new fax number. She wants to get her surgery scheduled now.

## 2017-04-04 NOTE — Telephone Encounter (Signed)
Have not seen pt for present problem of carpal tunnel syndrome-can we obtain any notes referable to that diagnosis?

## 2017-04-04 NOTE — Telephone Encounter (Signed)
Patient calling to find out if Dr. Alona Bene sent in a letter stating patient was cleared for surgery. Please call patient to advise.

## 2017-04-05 ENCOUNTER — Telehealth (INDEPENDENT_AMBULATORY_CARE_PROVIDER_SITE_OTHER): Payer: Self-pay | Admitting: Orthopaedic Surgery

## 2017-04-05 NOTE — Telephone Encounter (Signed)
LVMOM for her to schedule an appt

## 2017-04-05 NOTE — Telephone Encounter (Signed)
Patient called wanting to know if you have received a note or letter from her diabetic doctor stating that she was cleared to have shoulder surgery.  Thank you.

## 2017-04-05 NOTE — Telephone Encounter (Signed)
Needs an appt

## 2017-04-05 NOTE — Telephone Encounter (Signed)
This is for her shoulder pain. Pt seen 12/11/16.

## 2017-04-27 ENCOUNTER — Encounter (INDEPENDENT_AMBULATORY_CARE_PROVIDER_SITE_OTHER): Payer: Self-pay | Admitting: Orthopaedic Surgery

## 2017-04-27 ENCOUNTER — Ambulatory Visit (INDEPENDENT_AMBULATORY_CARE_PROVIDER_SITE_OTHER): Payer: Medicaid Other | Admitting: Orthopaedic Surgery

## 2017-04-27 VITALS — BP 131/91 | HR 122 | Resp 16 | Ht 64.0 in | Wt 174.0 lb

## 2017-04-27 DIAGNOSIS — G8929 Other chronic pain: Secondary | ICD-10-CM | POA: Diagnosis not present

## 2017-04-27 DIAGNOSIS — M25512 Pain in left shoulder: Secondary | ICD-10-CM | POA: Diagnosis not present
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
## 2017-05-17 ENCOUNTER — Encounter: Payer: Self-pay | Admitting: Orthopaedic Surgery

## 2017-05-17 DIAGNOSIS — M75111 Incomplete rotator cuff tear or rupture of right shoulder, not specified as traumatic: Secondary | ICD-10-CM | POA: Diagnosis not present

## 2017-05-17 DIAGNOSIS — M7541 Impingement syndrome of right shoulder: Secondary | ICD-10-CM | POA: Diagnosis not present

## 2017-05-17 DIAGNOSIS — M19011 Primary osteoarthritis, right shoulder: Secondary | ICD-10-CM | POA: Diagnosis not present

## 2017-05-21 ENCOUNTER — Encounter (INDEPENDENT_AMBULATORY_CARE_PROVIDER_SITE_OTHER): Payer: Self-pay | Admitting: Orthopaedic Surgery

## 2017-05-21 ENCOUNTER — Ambulatory Visit (INDEPENDENT_AMBULATORY_CARE_PROVIDER_SITE_OTHER): Payer: Medicaid Other | Admitting: Orthopaedic Surgery

## 2017-05-21 VITALS — BP 152/116 | HR 110 | Resp 14 | Ht 63.0 in | Wt 170.0 lb

## 2017-05-21 DIAGNOSIS — G8929 Other chronic pain: Secondary | ICD-10-CM

## 2017-05-21 DIAGNOSIS — M25511 Pain in right shoulder: Secondary | ICD-10-CM

## 2017-05-21 NOTE — Progress Notes (Signed)
Office Visit Note   Patient: April Obrien           Date of Birth: 1961/08/19           MRN: [id number] Visit Date: 05/21/2017              Requested by: Alma Friendly, MD No address on file PCP: Alma Friendly, MD   Assessment & Plan: Visit Diagnoses:  1. Chronic right shoulder pain   4 days status post right shoulder arthroscopic subacromial decompression. Mini open rotator cuff tear repair. Chronic tear of the deltoid at its attachment to the anterior acromion that was also repaired. The rotator cuff was quite frayed on the bursal surface and this was reinforced  Plan: Continue sling. Over-the-counter medicines. Circumduction exercises. Office 1 week and consider physical therapy  Follow-Up Instructions: Return in about 1 week (around 05/28/2017).   Orders:  No orders of the defined types were placed in this encounter.  No orders of the defined types were placed in this encounter.     Procedures: No procedures performed   Clinical Data: No additional findings.   Subjective: Chief Complaint  Patient presents with  . Right Shoulder - Routine Post Op    April Obrien is a 56 y o that is 4 days status post Right shoulder arthroscopy. She relates she had itching and vomiting from the Oxycodone. DR on call told her to stop med and take ibuprofen.  Taking a combination of Advil and Tylenol and doing quite well. Denies fever chills shortness of breath or chest  HPI  Review of Systems  Constitutional: Positive for fatigue. Negative for chills and fever.  Eyes: Negative for itching.  Respiratory: Negative for chest tightness and shortness of breath.   Cardiovascular: Negative for chest pain, palpitations and leg swelling.  Gastrointestinal: Negative for blood in stool, constipation and diarrhea.  Musculoskeletal: Positive for neck pain and neck stiffness. Negative for back pain and joint swelling.  Neurological: Positive for dizziness, weakness, light-headedness  and headaches. Negative for numbness.  Hematological: Does not bruise/bleed easily.  Psychiatric/Behavioral: Positive for sleep disturbance. The patient is nervous/anxious.      Objective: Vital Signs: BP (!) 152/116   Pulse (!) 110   Resp 14   Ht 5\' 3"  (1.6 m)   Wt 170 lb (77.1 kg)   BMI 30.11 kg/m   Physical Exam  Ortho Exam right shoulder incisions healing without evidence of infection. Neurovascular exam intact  Specialty Comments:  No specialty comments available.  Imaging: No results found.   PMFS History: Patient Active Problem List   Diagnosis Date Noted  . Lumbar spinal stenosis 08/06/2014   Past Medical History:  Diagnosis Date  . Barrett syndrome   . Chronic back pain   . Depression   . Diabetes mellitus without complication ([place])   . GERD (gastroesophageal reflux disease)   . Hepatitis    PMH: Hep A  . Hypercholesterolemia   . Hypertension   . Hypothyroidism   . Wears glasses     Family History  Problem Relation Age of Onset  . Diabetes Father   . Heart disease Father     Past Surgical History:  Procedure Laterality Date  . AV FISTULA REPAIR    . BACK SURGERY    . CARPAL TUNNEL RELEASE    . COLONOSCOPY    . CYSTECTOMY    . elbow     right lateral epicondylitis  . HERNIA REPAIR    . KNEE  SURGERY     arthroscopy on right knee  . SHOULDER ARTHROSCOPY     right    Social History   Occupational History  . Not on file.   Social History Main Topics  . Smoking status: Former Smoker    Packs/day: 1.00    Years: 26.00    Types: Cigarettes    Start date: 01/20/1975    Quit date: 06/25/2014  . Smokeless tobacco: Never Used     Comment: currently usig "vape"  . Alcohol use 0.0 oz/week     Comment: rare  . Drug use: No  . Sexual activity: Not on file

## 2017-05-29 ENCOUNTER — Encounter (INDEPENDENT_AMBULATORY_CARE_PROVIDER_SITE_OTHER): Payer: Self-pay | Admitting: Orthopaedic Surgery

## 2017-05-29 ENCOUNTER — Ambulatory Visit (INDEPENDENT_AMBULATORY_CARE_PROVIDER_SITE_OTHER): Payer: Medicaid Other | Admitting: Orthopaedic Surgery

## 2017-05-29 VITALS — BP 166/108 | HR 100 | Resp 16 | Ht 64.0 in | Wt 174.0 lb

## 2017-05-29 DIAGNOSIS — M75122 Complete rotator cuff tear or rupture of left shoulder, not specified as traumatic: Secondary | ICD-10-CM

## 2017-05-29 NOTE — Progress Notes (Signed)
   Post-Op Visit Note   Patient: April Obrien           Date of Birth: Apr 07, 1961           MRN: [id number] Visit Date: 05/29/2017 PCP: Alma Friendly, MD   Assessment & Plan:  Chief Complaint:  Chief Complaint  Patient presents with  . Right Shoulder - Routine Post Op  . Shoulder Pain    Pain   Visit Diagnoses:  1. Complete rotator cuff rupture of left shoulder     Plan: 11 days status post rotator cuff tear repair and repair of a diastases in the anterior deltoid. Doing well. Taking Advil only. Still using a sling. Started circumduction exercises. Continue sling. Start outpatient physical therapy  In  [place] for passive range of motion for 1 month. Office 3 weeks. Wound healing without evidence of infection. Neurovascular exam intact. Able to flex and abduct passively 90 without a problem  Follow-Up Instructions: Return in about 13 weeks (around 08/28/2017).   Orders:  Orders Placed This Encounter  Procedures  . Ambulatory referral to Physical Therapy   No orders of the defined types were placed in this encounter.   Imaging: No results found.  PMFS History: Patient Active Problem List   Diagnosis Date Noted  . Lumbar spinal stenosis 08/06/2014   Past Medical History:  Diagnosis Date  . Barrett syndrome   . Chronic back pain   . Depression   . Diabetes mellitus without complication ([place])   . GERD (gastroesophageal reflux disease)   . Hepatitis    PMH: Hep A  . Hypercholesterolemia   . Hypertension   . Hypothyroidism   . Wears glasses     Family History  Problem Relation Age of Onset  . Diabetes Father   . Heart disease Father     Past Surgical History:  Procedure Laterality Date  . AV FISTULA REPAIR    . BACK SURGERY    . CARPAL TUNNEL RELEASE    . COLONOSCOPY    . CYSTECTOMY    . elbow     right lateral epicondylitis  . HERNIA REPAIR    . KNEE SURGERY     arthroscopy on right knee  . SHOULDER ARTHROSCOPY     right    Social  History   Occupational History  . Not on file.   Social History Main Topics  . Smoking status: Former Smoker    Packs/day: 1.00    Years: 26.00    Types: Cigarettes    Start date: 01/20/1975    Quit date: 06/25/2014  . Smokeless tobacco: Never Used     Comment: currently usig "vape"  . Alcohol use 0.0 oz/week     Comment: rare  . Drug use: No  . Sexual activity: Not on file

## 2017-06-25 ENCOUNTER — Encounter (INDEPENDENT_AMBULATORY_CARE_PROVIDER_SITE_OTHER): Payer: Self-pay | Admitting: Orthopaedic Surgery

## 2017-06-25 ENCOUNTER — Ambulatory Visit (INDEPENDENT_AMBULATORY_CARE_PROVIDER_SITE_OTHER): Payer: Medicaid Other | Admitting: Orthopaedic Surgery

## 2017-06-25 VITALS — BP 126/80 | HR 82 | Resp 12 | Ht 64.0 in | Wt 165.0 lb

## 2017-06-25 DIAGNOSIS — G8929 Other chronic pain: Secondary | ICD-10-CM

## 2017-06-25 DIAGNOSIS — M25511 Pain in right shoulder: Secondary | ICD-10-CM

## 2017-06-25 NOTE — Progress Notes (Signed)
Office Visit Note   Patient: April Obrien           Date of Birth: Apr 17, 1961           MRN: [id number] Visit Date: 06/25/2017              Requested by: Alma Friendly, MD No address on file PCP: Alma Friendly, MD   Assessment & Plan: Visit Diagnoses:  1. Chronic right shoulder pain     Plan: 5 weeks status post right shoulder rotator cuff tear repair with arthroscopic subacromial decompression and I also repaired a chronic deltoid From its attachment. Presently doing well and not having any the pain she had preoperatively. Is working with physical therapy. Continue sling for 2 more weeks and then wean from the sling. Office 1 month office 1 month  Follow-Up Instructions: Return in about 1 month (around 07/26/2017).   Orders:  No orders of the defined types were placed in this encounter.  No orders of the defined types were placed in this encounter.     Procedures: No procedures performed   Clinical Data: No additional findings.   Subjective: Chief Complaint  Patient presents with  . Right Shoulder - Follow-up  No history of fever or chills. Feeling much better in terms of her right shoulder much of her preoperative pain has resolved  HPI  Review of Systems  Constitutional: Negative for chills, fatigue and fever.  Eyes: Negative for itching.  Respiratory: Negative for chest tightness and shortness of breath.   Cardiovascular: Negative for chest pain, palpitations and leg swelling.  Gastrointestinal: Negative for blood in stool, constipation and diarrhea.  Endocrine: Negative for polyuria.  Genitourinary: Negative for dysuria.  Musculoskeletal: Positive for back pain, neck pain and neck stiffness. Negative for joint swelling.  Allergic/Immunologic: Negative for immunocompromised state.  Neurological: Positive for numbness. Negative for dizziness.  Hematological: Does not bruise/bleed easily.  Psychiatric/Behavioral: Positive for sleep disturbance. The  patient is nervous/anxious.      Objective: Vital Signs: BP 126/80   Pulse 82   Resp 12   Ht 5\' 4"  (1.626 m)   Wt 165 lb (74.8 kg)   BMI 28.32 kg/m   Physical Exam  Ortho Exam awake alert and oriented 3 comfortable sitting no significant pain. No obvious distress. I'm able to passively place her arm almost all the way overhead and flexion with a little loss of about 20-25. Full abduction. Feeling without problem. Good grip and release.  Specialty Comments:  No specialty comments available.  Imaging: No results found.   PMFS History: Patient Active Problem List   Diagnosis Date Noted  . Lumbar spinal stenosis 08/06/2014   Past Medical History:  Diagnosis Date  . Barrett syndrome   . Chronic back pain   . Depression   . Diabetes mellitus without complication ([place])   . GERD (gastroesophageal reflux disease)   . Hepatitis    PMH: Hep A  . Hypercholesterolemia   . Hypertension   . Hypothyroidism   . Wears glasses     Family History  Problem Relation Age of Onset  . Diabetes Father   . Heart disease Father     Past Surgical History:  Procedure Laterality Date  . AV FISTULA REPAIR    . BACK SURGERY    . CARPAL TUNNEL RELEASE    . COLONOSCOPY    . CYSTECTOMY    . elbow     right lateral epicondylitis  . HERNIA REPAIR    .  KNEE SURGERY     arthroscopy on right knee  . SHOULDER ARTHROSCOPY     right    Social History   Occupational History  . Not on file.   Social History Main Topics  . Smoking status: Former Smoker    Packs/day: 1.00    Years: 26.00    Types: Cigarettes    Start date: 01/20/1975    Quit date: 06/25/2014  . Smokeless tobacco: Never Used     Comment: currently usig "vape"  . Alcohol use 0.0 oz/week     Comment: rare  . Drug use: No  . Sexual activity: Not on file

## 2017-07-24 ENCOUNTER — Ambulatory Visit (INDEPENDENT_AMBULATORY_CARE_PROVIDER_SITE_OTHER): Payer: Medicaid Other | Admitting: Orthopaedic Surgery

## 2017-07-27 ENCOUNTER — Ambulatory Visit (INDEPENDENT_AMBULATORY_CARE_PROVIDER_SITE_OTHER): Payer: Medicaid Other | Admitting: Orthopaedic Surgery

## 2017-07-30 ENCOUNTER — Encounter (INDEPENDENT_AMBULATORY_CARE_PROVIDER_SITE_OTHER): Payer: Self-pay | Admitting: Orthopaedic Surgery

## 2017-07-30 ENCOUNTER — Other Ambulatory Visit (INDEPENDENT_AMBULATORY_CARE_PROVIDER_SITE_OTHER): Payer: Self-pay

## 2017-07-30 ENCOUNTER — Ambulatory Visit (INDEPENDENT_AMBULATORY_CARE_PROVIDER_SITE_OTHER): Payer: Medicaid Other | Admitting: Orthopaedic Surgery

## 2017-07-30 ENCOUNTER — Ambulatory Visit (INDEPENDENT_AMBULATORY_CARE_PROVIDER_SITE_OTHER): Payer: Medicaid Other

## 2017-07-30 VITALS — BP 138/85 | HR 102 | Resp 12 | Ht 63.0 in | Wt 175.0 lb

## 2017-07-30 DIAGNOSIS — G8929 Other chronic pain: Secondary | ICD-10-CM | POA: Diagnosis not present

## 2017-07-30 DIAGNOSIS — M25511 Pain in right shoulder: Secondary | ICD-10-CM

## 2017-07-30 DIAGNOSIS — M533 Sacrococcygeal disorders, not elsewhere classified: Secondary | ICD-10-CM

## 2017-07-30 NOTE — Addendum Note (Signed)
Addended by: Mervyn Skeeters on: 07/30/2017 02:26 PM   Modules accepted: Orders
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
## 2017-08-28 ENCOUNTER — Encounter (INDEPENDENT_AMBULATORY_CARE_PROVIDER_SITE_OTHER): Payer: Self-pay | Admitting: Physical Medicine and Rehabilitation

## 2017-08-28 ENCOUNTER — Ambulatory Visit (INDEPENDENT_AMBULATORY_CARE_PROVIDER_SITE_OTHER): Payer: Medicaid Other | Admitting: Physical Medicine and Rehabilitation

## 2017-08-28 ENCOUNTER — Ambulatory Visit (INDEPENDENT_AMBULATORY_CARE_PROVIDER_SITE_OTHER): Payer: Medicaid Other

## 2017-08-28 DIAGNOSIS — M533 Sacrococcygeal disorders, not elsewhere classified: Secondary | ICD-10-CM | POA: Diagnosis not present
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
## 2017-08-28 NOTE — Patient Instructions (Signed)

## 2017-09-04 ENCOUNTER — Encounter (INDEPENDENT_AMBULATORY_CARE_PROVIDER_SITE_OTHER): Payer: Self-pay | Admitting: Physical Medicine and Rehabilitation

## 2017-09-04 MED ORDER — TRIAMCINOLONE ACETONIDE 40 MG/ML IJ SUSP
80.0000 mg | INTRAMUSCULAR | Status: AC | PRN
  Administered 2017-08-28: 80 mg via INTRA_ARTICULAR

## 2017-09-04 MED ORDER — BUPIVACAINE HCL 0.5 % IJ SOLN
3.0000 mL | INTRAMUSCULAR | Status: AC | PRN
  Administered 2017-08-28: 3 mL via INTRA_ARTICULAR

## 2018-04-08 ENCOUNTER — Encounter (INDEPENDENT_AMBULATORY_CARE_PROVIDER_SITE_OTHER): Payer: Self-pay | Admitting: Orthopaedic Surgery

## 2018-04-08 ENCOUNTER — Other Ambulatory Visit (INDEPENDENT_AMBULATORY_CARE_PROVIDER_SITE_OTHER): Payer: Self-pay | Admitting: Radiology

## 2018-04-08 ENCOUNTER — Ambulatory Visit (INDEPENDENT_AMBULATORY_CARE_PROVIDER_SITE_OTHER): Payer: Medicaid Other | Admitting: Orthopaedic Surgery

## 2018-04-08 ENCOUNTER — Ambulatory Visit (INDEPENDENT_AMBULATORY_CARE_PROVIDER_SITE_OTHER): Payer: Medicaid Other

## 2018-04-08 VITALS — BP 167/106 | HR 104 | Ht 63.0 in | Wt 175.0 lb

## 2018-04-08 DIAGNOSIS — G8929 Other chronic pain: Secondary | ICD-10-CM

## 2018-04-08 DIAGNOSIS — M25512 Pain in left shoulder: Secondary | ICD-10-CM

## 2018-04-08 NOTE — Progress Notes (Signed)
Office Visit Note   Patient: April Obrien           Date of Birth: [birth year]           MRN: [id number] Visit Date: 04/08/2018              Requested by: Alma Friendly, MD No address on file PCP: Alma Friendly, MD   Assessment & Plan: Visit Diagnoses:  1. Acute pain of left shoulder     Plan: Insidious onset of left shoulder pain approximately a year ago.  Now having considerable compromise of activities.  Has had prior rotator cuff tear repair on the right.  Will order MRI scan left shoulder.  Long discussion regarding diagnostic possibilities and treatment options.  April Obrien is diabetic and I am concerned about cortisone.  She has tried over-the-counter medicines reached a point where she is actually losing overhead motion  Follow-Up Instructions: Return after MRI left shoulder.   Orders:  Orders Placed This Encounter  Procedures  . XR Shoulder Left   No orders of the defined types were placed in this encounter.     Procedures: No procedures performed   Clinical Data: No additional findings.   Subjective: Chief Complaint  Patient presents with  . Follow-up    L SHOULDER PAIN FOR 1 YR JUST GETTING WORSE, BANGED ON CAR TO GET CAR DOOR OPENED AND HURT SHOULDER UNABLE TO WEAR BRA. HAD RIGHT SHOULDER SURGERY 07/2017  Chronic pain left shoulder for approximately 1 year.  Probably started trying to open a "stuck car door".  Pain is very similar to that which she experienced on the right.  Has had prior rotator cuff tear repair on the right.  No numbness or tingling.  Experiencing difficulty with overhead motion and sleeping on that side.  Is concerned about cortisone injection with her diabetes and "fear of needles"  HPI  Review of Systems  Constitutional: Positive for fatigue. Negative for fever.  HENT: Negative for ear pain.   Eyes: Negative for pain.  Respiratory: Negative for cough and shortness of breath.   Cardiovascular: Negative for leg swelling.    Gastrointestinal: Positive for constipation. Negative for diarrhea.  Genitourinary: Negative for difficulty urinating.  Musculoskeletal: Positive for neck pain. Negative for back pain.  Skin: Negative for rash.  Allergic/Immunologic: Negative for food allergies.  Neurological: Positive for weakness. Negative for numbness.  Hematological: Does not bruise/bleed easily.  Psychiatric/Behavioral: Positive for sleep disturbance.     Objective: Vital Signs: BP (!) 167/106 (BP Location: Right Arm, Patient Position: Sitting, Cuff Size: Normal)   Pulse (!) 104   Ht 5\' 3"  (1.6 m)   Wt 175 lb (79.4 kg)   BMI 31.00 kg/m   Physical Exam  Constitutional: She is oriented to person, place, and time. She appears well-developed and well-nourished.  HENT:  Mouth/Throat: Oropharynx is clear and moist.  Eyes: Pupils are equal, round, and reactive to light. EOM are normal.  Pulmonary/Chest: Effort normal.  Neurological: She is alert and oriented to person, place, and time.  Skin: Skin is warm and dry.  Psychiatric: She has a normal mood and affect. Her behavior is normal.    Ortho Exam wake alert and oriented x3.  Comfortable sitting.  Anxious regarding possible diagnosis of rotator cuff tear.  I was able to place the left arm fully overhead but with a very circuitous motion with positive impingement.  Positive empty can.  Skin intact.  Biceps intact.  Some pain along the anterior subacromial  region.  Good strength.  Good grip and good release.  No neck pain  Specialty Comments:  No specialty comments available.  Imaging: Xr Shoulder Left  Result Date: 04/08/2018 Films of left shoulder were obtained in several projections.  There is some prominence of the anterior acromion.  Normal space between the humeral head and the acromium.  Glenoid and humeral head are centered.  No ectopic calcification.  Some bulky mild degenerative changes about the acromioclavicular joint with no acute changes    PMFS  History: Patient Active Problem List   Diagnosis Date Noted  . Lumbar spinal stenosis 08/06/2014   Past Medical History:  Diagnosis Date  . Barrett syndrome   . Chronic back pain   . Depression   . Diabetes mellitus without complication ([place])   . GERD (gastroesophageal reflux disease)   . Hepatitis    PMH: Hep A  . Hypercholesterolemia   . Hypertension   . Hypothyroidism   . Wears glasses     Family History  Problem Relation Age of Onset  . Diabetes Father   . Heart disease Father     Past Surgical History:  Procedure Laterality Date  . AV FISTULA REPAIR    . BACK SURGERY    . CARPAL TUNNEL RELEASE    . COLONOSCOPY    . CYSTECTOMY    . elbow     right lateral epicondylitis  . HERNIA REPAIR    . KNEE SURGERY     arthroscopy on right knee  . SHOULDER ARTHROSCOPY     right    Social History   Occupational History  . Not on file  Tobacco Use  . Smoking status: Former Smoker    Packs/day: 1.00    Years: 26.00    Pack years: 26.00    Types: Cigarettes    Start date: 01/20/1975    Last attempt to quit: 06/25/2014    Years since quitting: 3.7  . Smokeless tobacco: Never Used  . Tobacco comment: currently usig "vape"  Substance and Sexual Activity  . Alcohol use: Yes    Alcohol/week: 0.0 oz    Comment: rare  . Drug use: No  . Sexual activity: Not on file

## 2018-04-22 ENCOUNTER — Other Ambulatory Visit: Payer: Self-pay | Admitting: *Deleted

## 2018-04-22 ENCOUNTER — Telehealth (INDEPENDENT_AMBULATORY_CARE_PROVIDER_SITE_OTHER): Payer: Self-pay | Admitting: Orthopaedic Surgery

## 2018-04-22 ENCOUNTER — Other Ambulatory Visit: Payer: Self-pay

## 2018-04-22 DIAGNOSIS — G8929 Other chronic pain: Secondary | ICD-10-CM

## 2018-04-22 DIAGNOSIS — M25512 Pain in left shoulder: Principal | ICD-10-CM

## 2018-04-22 NOTE — Telephone Encounter (Signed)
Patient states Medicaid denied MRI that she had scheduled for today. Per patient [facility] states they need a new order for patients Lt Shoulder MRI to get approval. Please call to advise.

## 2018-04-22 NOTE — Telephone Encounter (Signed)
Please let Roxy know when you want to complete the peer-to-peer.

## 2018-04-22 NOTE — Telephone Encounter (Signed)
Has Peer to Peer been done on this pt?

## 2018-04-22 NOTE — Telephone Encounter (Signed)
Did Dr. Durward Fortes do anything with this?

## 2018-04-22 NOTE — Progress Notes (Signed)
MRI was denied after peer-to-peer with Dr. Durward Fortes, per insurance patient needs to try PT x 3, order in Epic. MRI needs to be reorderd after PT x 3 visits complete.

## 2018-04-23 NOTE — Telephone Encounter (Signed)
Peer to peer has been done. Pt doing pt once a month for 3 mo before insurance will approve it

## 2018-05-07 ENCOUNTER — Ambulatory Visit: Payer: Medicaid Other | Admitting: Physical Therapy

## 2018-05-08 ENCOUNTER — Encounter: Payer: Self-pay | Admitting: Physical Therapy

## 2018-05-08 ENCOUNTER — Other Ambulatory Visit: Payer: Self-pay

## 2018-05-08 ENCOUNTER — Ambulatory Visit: Payer: Medicaid Other | Attending: Orthopaedic Surgery | Admitting: Physical Therapy

## 2018-05-08 DIAGNOSIS — M25612 Stiffness of left shoulder, not elsewhere classified: Secondary | ICD-10-CM

## 2018-05-08 DIAGNOSIS — R29818 Other symptoms and signs involving the nervous system: Secondary | ICD-10-CM

## 2018-05-08 DIAGNOSIS — M25512 Pain in left shoulder: Secondary | ICD-10-CM | POA: Diagnosis not present

## 2018-05-08 DIAGNOSIS — R29898 Other symptoms and signs involving the musculoskeletal system: Secondary | ICD-10-CM | POA: Diagnosis present

## 2018-05-08 DIAGNOSIS — G8929 Other chronic pain: Secondary | ICD-10-CM

## 2018-05-08 NOTE — Therapy (Addendum)
[facility] [address]  [place], [place], [address] Phone: [phone]   Fax:  [phone]  Physical Therapy Evaluation  Patient Details  Name: April Obrien MRN: [id number] Date of Birth: 08-12-1961 Referring Provider: Joni Fears, MD   Encounter Date: 05/08/2018  PT End of Session - 05/08/18 0817    Visit Number  1    Number of Visits  3    Date for PT Re-Evaluation  05/29/18    Authorization Type  Medicaid    PT Start Time  0801    PT Stop Time  2831    PT Time Calculation (min)  43 min    Activity Tolerance  Patient tolerated treatment well;Patient limited by pain    Behavior During Therapy  [facility] for tasks assessed/performed       Past Medical History:  Diagnosis Date  . Barrett syndrome   . Chronic back pain   . Depression   . Diabetes mellitus without complication ([place])   . GERD (gastroesophageal reflux disease)   . Hepatitis    PMH: Hep A  . Hypercholesterolemia   . Hypertension   . Hypothyroidism   . Wears glasses     Past Surgical History:  Procedure Laterality Date  . AV FISTULA REPAIR    . BACK SURGERY    . CARPAL TUNNEL RELEASE    . COLONOSCOPY    . CYSTECTOMY    . elbow     right lateral epicondylitis  . HERNIA REPAIR    . KNEE SURGERY     arthroscopy on right knee  . SHOULDER ARTHROSCOPY     right     There were no vitals filed for this visit.   Subjective Assessment - 05/08/18 1216    Subjective  Pt states that she began having pain in the L shoulder about 6 months ago, which coincides with when her driver's side door broke, and she began having to use a significant amount of force with the shoulder to open the door. She got this door fixed about 2 days ago, but is in a significant amount of pain from having to repetitively perform this motion. She describes that she has a "pins and needles" feeling down into the L thumb, and that her pain has been keeping her from  sleeping at night. She is unable to lie on the L side and is unable to sleep in her bed. She attempts to sleep in the recliner but does not get any relief with this. She also experiences some dizziness when moving the head quickly, and avoids extending her neck back to avoid dizziness.     Limitations  Lifting;House hold activities;Other (comment)   Opening car door, any motion of the L arm   Patient Stated Goals  decrease pain, improve ROM     Currently in Pain?  Yes    Pain Score  6    10 at worst   Pain Location  Shoulder    Pain Orientation  Left    Pain Descriptors / Indicators  Aching;Burning;Sharp;Pins and needles;Discomfort;Dull;Sore    Pain Type  Chronic pain    Pain Radiating Towards  Down into the L bicep, L forearm, and L thumb    Pain Onset  More than a month ago    Aggravating Factors   any movement of the L UE, putting on clothes    Pain Relieving Factors  Rest    Effect of Pain on  Daily Activities  Pt is unable to don a bra, and has compensated significantly through daily activities to utilize the R UE more    Multiple Pain Sites  Yes    Pain Score  6    Pain Location  Neck    Pain Descriptors / Indicators  Sharp;Constant;Aching    Pain Type  Chronic pain    Pain Onset  More than a month ago    Pain Frequency  Constant    Aggravating Factors   Looking up; moving the neck         [facility] PT Assessment - 05/08/18 0001      Assessment   Medical Diagnosis  Chronic L shoulder pain     Referring Provider  Joni Fears, MD    Onset Date/Surgical Date  --   Approx 6 months ago   Hand Dominance  Right    Next MD Visit  PRN    Prior Therapy  Yes      Balance Screen   Has the patient fallen in the past 6 months  No      Prior Function   Level of Independence  Independent    Vocation  --    Leisure  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      Posture/Postural Control   Posture/Postural Control  Postural limitations    Postural  Limitations  Rounded Shoulders;Forward head;Increased thoracic kyphosis    Posture Comments  Pt has very rounded shoulders in sitting coupled with neck flexion and habitually sits looking down toward the lap      ROM / Strength   AROM / PROM / Strength  AROM;Strength;PROM      AROM   AROM Assessment Site  Shoulder    Right/Left Shoulder  Right;Left    Left Shoulder Flexion  118 Degrees    Left Shoulder ABduction  58 Degrees      PROM   PROM Assessment Site  Shoulder    Right/Left Shoulder  Left    Left Shoulder Internal Rotation  58 Degrees   At approx 70 degrees of shoulder abd secondary to pain   Left Shoulder External Rotation  33 Degrees   at 55 degrees of glenohumeral abd secondary to pain     Strength   Strength Assessment Site  Shoulder    Right/Left Shoulder  Right;Left    Right Shoulder Flexion  4+/5    Right Shoulder ABduction  4/5    Left Shoulder Flexion  3-/5    Left Shoulder ABduction  2-/5    Left Shoulder Internal Rotation  2/5    Left Shoulder External Rotation  2-/5      Palpation   Palpation comment  Pt expressed significant tenderness to palpation over general L glenohumeral joint with very sharp pains upon palpation of L supraspinatus and over the anterior region of the L shoulder joint. With any palpation along the L arm, pt expressed discomfort and pained fascial expression.                 Objective measurements completed on examination: See above findings.      [facility] Adult PT Treatment/Exercise - 05/08/18 0001      Exercises   Exercises  Shoulder;Neck      Shoulder Exercises: Seated   Retraction  Both;10 reps    Retraction Limitations  With VC for scapular depression      Shoulder Exercises: Standing   Flexion  AAROM;10  reps;Left    Flexion Limitations  Using wand and R UE for AAROM    ABduction  AAROM;10 reps;Left    ABduction Limitations  Using wand and R UE for AAROM             PT Education - 05/08/18 1230     Education Details  Pt educated on results of evaluation, POC, and HEP exercises    Person(s) Educated  Patient    Methods  Explanation;Demonstration;Handout    Comprehension  Verbalized understanding;Returned demonstration          PT Long Term Goals - 05/08/18 1249      PT LONG TERM GOAL #1   Title  Pt will be independent with HEP program     Status  New    Target Date  05/29/18      PT LONG TERM GOAL #2   Title  Pt will report pain of 8/10 at worst with PROM into abduction     Status  New    Target Date  05/29/18      PT LONG TERM GOAL #3   Title  Pt will report 25% improvement in condition to improve tolerance to functional activities    Status  New    Target Date  05/29/18             Plan - 05/08/18 1232    Clinical Impression Statement  Pt is a 57 y/o F who presents to OP PT with c/c of L shoulder pain that occurs with movement in all directions. Examination revealed increased tenderness to palpation, decreased ROM with increased pain, and abnormal posture. These impairments are limiting pt's ability to perform ADLs including donning/doffing clothes, lifting items such as groceries, getting enough/good quality sleep, and ability to perform activities including opening her car door. She will benefit from physical therapy to address these impairments, improve her quality of life, and progress toward functional goals.     History and Personal Factors relevant to plan of care:  Hx of R RCR, cervical ablation scheduled for 05/13/2018    Clinical Presentation  Evolving    Clinical Presentation due to:  Significant comorbidities, high pain levels, and postural abnormalities that are all contributing to current presentation.     Clinical Decision Making  Moderate    Rehab Potential  Fair    Clinical Impairments Affecting Rehab Potential  Pt stated during eval that she is participating in 3 visits of PT in order to get insurance authorization for MRI     PT Frequency  1x /  week    PT Duration  3 weeks    PT Treatment/Interventions  ADLs/Self Care Home Management;Cryotherapy;Electrical Stimulation;Moist Heat;Ultrasound;Functional mobility training;Therapeutic activities;Therapeutic exercise;Neuromuscular re-education;Patient/family education;Manual techniques;Passive range of motion;Dry needling;Taping;Vasopneumatic Device    PT Next Visit Plan  Follow-up on patient's neuro symptoms in L UE    Consulted and Agree with Plan of Care  Patient       Patient will benefit from skilled therapeutic intervention in order to improve the following deficits and impairments:  Hypomobility, Impaired sensation, Decreased activity tolerance, Decreased strength, Impaired UE functional use, Pain, Increased muscle spasms, Decreased range of motion, Improper body mechanics, Postural dysfunction, Dizziness  Visit Diagnosis: Chronic left shoulder pain  Stiffness of left shoulder, not elsewhere classified  Other symptoms and signs involving the nervous system  Other symptoms and signs involving the musculoskeletal system     Problem List Patient Active Problem List   Diagnosis Date Noted  .  Lumbar spinal stenosis 08/06/2014    Shirline Frees, SPT   05/08/2018, 1:29 PM  [facility] [address]  [place], [place], [address] Phone: [phone]   Fax:  [phone]  Name: April Obrien MRN: [id number] Date of Birth: [birth year]

## 2018-05-15 ENCOUNTER — Ambulatory Visit: Payer: Medicaid Other

## 2018-05-22 ENCOUNTER — Ambulatory Visit: Payer: Medicaid Other | Admitting: Physical Therapy

## 2018-05-22 ENCOUNTER — Encounter: Payer: Self-pay | Admitting: Physical Therapy

## 2018-05-22 DIAGNOSIS — M25512 Pain in left shoulder: Principal | ICD-10-CM

## 2018-05-22 DIAGNOSIS — R29818 Other symptoms and signs involving the nervous system: Secondary | ICD-10-CM

## 2018-05-22 DIAGNOSIS — M25612 Stiffness of left shoulder, not elsewhere classified: Secondary | ICD-10-CM

## 2018-05-22 DIAGNOSIS — G8929 Other chronic pain: Secondary | ICD-10-CM

## 2018-05-22 DIAGNOSIS — R29898 Other symptoms and signs involving the musculoskeletal system: Secondary | ICD-10-CM

## 2018-05-22 NOTE — Therapy (Signed)
[facility] [address]  [place], [place], [address] Phone: [phone]   Fax:  [phone]  Physical Therapy Treatment  Patient Details  Name: April Obrien MRN: [id number] Date of Birth: October 29, 1960 Referring Provider: Joni Fears, MD   Encounter Date: 05/22/2018  PT End of Session - 05/22/18 1037    Visit Number  2    Number of Visits  4    Date for PT Re-Evaluation  05/29/18    Authorization Type  Medicaid    Authorization Time Period  05/09/18 - 05/29/18    Authorization - Visit Number  1    Authorization - Number of Visits  3    PT Start Time  1037    PT Stop Time  1126    PT Time Calculation (min)  49 min    Activity Tolerance  Patient tolerated treatment well;Patient limited by pain    Behavior During Therapy  [facility] for tasks assessed/performed       Past Medical History:  Diagnosis Date  . Barrett syndrome   . Chronic back pain   . Depression   . Diabetes mellitus without complication ([place])   . GERD (gastroesophageal reflux disease)   . Hepatitis    PMH: Hep A  . Hypercholesterolemia   . Hypertension   . Hypothyroidism   . Wears glasses     Past Surgical History:  Procedure Laterality Date  . AV FISTULA REPAIR    . BACK SURGERY    . CARPAL TUNNEL RELEASE    . COLONOSCOPY    . CYSTECTOMY    . elbow     right lateral epicondylitis  . HERNIA REPAIR    . KNEE SURGERY     arthroscopy on right knee  . SHOULDER ARTHROSCOPY     right     There were no vitals filed for this visit.  Subjective Assessment - 05/22/18 1040    Subjective  Pt reporting she is having trouble sleeping due to her shoulder. Missed last week's appt due cervical ablation and is scheduled for another one on 06/03/18. Not doing HEP but tries to walk arm up shower wall while hot water running over shoulder.    Patient Stated Goals  decrease pain, improve ROM     Currently in Pain?  Yes    Pain Score  6     Pain  Location  Shoulder    Pain Orientation  Left    Pain Descriptors / Indicators  --   pulling   Pain Type  Chronic pain    Pain Onset  More than a month ago    Pain Frequency  Constant    Pain Onset  More than a month ago                       [facility] Adult PT Treatment/Exercise - 05/22/18 1037      Exercises   Exercises  Shoulder      Shoulder Exercises: Supine   External Rotation  Left;AAROM;10 reps    Flexion  Left;AAROM;10 reps    Flexion Limitations  wand      Shoulder Exercises: Seated   Retraction  Both;10 reps   5" hold   Retraction Limitations  VC for scapular depression    Flexion  Left;AAROM;10 reps    Flexion Limitations  table slides    Abduction  Left;AAROM;10 reps    ABduction Limitations  scaption table slides  Shoulder Exercises: Pulleys   Flexion  2 minutes    Flexion Limitations  poor tolerance due to pain      Shoulder Exercises: Isometric Strengthening   Flexion  5X5"    Extension  5X5"    External Rotation  5X5"    Internal Rotation  5X5"    ABduction  5X5"      Manual Therapy   Manual Therapy  Soft tissue mobilization;Myofascial release;Joint mobilization;Passive ROM    Manual therapy comments  supine    Joint Mobilization  Grade 1-2 L shoulder distraction +/- oscillations for pain relief - poor tolerance    Soft tissue mobilization  STM L shoulder - pecs, UT, supra & infraspinatus, teres group    Myofascial Release  attempted manual TPR to teres group & UT - limited tolerance for deep pressure, but twitch respones elicited    Passive ROM  L shoulder all planes - close to full PROM available but painful through >50% of ROM             PT Education - 05/22/18 1120    Education Details  HEP revision/update    Person(s) Educated  Patient    Methods  Explanation;Demonstration;Handout    Comprehension  Verbalized understanding;Returned demonstration          PT Long Term Goals - 05/22/18 1044      PT LONG TERM GOAL  #1   Title  Pt will be independent with HEP program     Status  On-going      PT LONG TERM GOAL #2   Title  Pt will report pain of 8/10 at worst with PROM into abduction     Status  On-going      PT LONG TERM GOAL #3   Title  Pt will report 25% improvement in condition to improve tolerance to functional activities    Status  On-going            Plan - 05/22/18 1045    Clinical Impression Statement  April Obrien reporting poor compliance with HEP due to pain - reports only exercise she tries is wall climbs in the shower. Modified HEP, switching wand exercises to supine with slightly better tolerance reported and adding table slides for additional AAROM. Also introduced submaximal L shoulder isometrics to initiate light strengthening. Limited tolerance for manual therapy including STM and PROM due to pain but declined any estim or thermal modalities for pain management.    Rehab Potential  Fair    Clinical Impairments Affecting Rehab Potential  Pt stated during eval that she is participating in 3 visits of PT in order to get insurance authorization for MRI     PT Treatment/Interventions  ADLs/Self Care Home Management;Cryotherapy;Electrical Stimulation;Moist Heat;Ultrasound;Functional mobility training;Therapeutic activities;Therapeutic exercise;Neuromuscular re-education;Patient/family education;Manual techniques;Passive range of motion;Dry needling;Taping;Vasopneumatic Device    Consulted and Agree with Plan of Care  Patient       Patient will benefit from skilled therapeutic intervention in order to improve the following deficits and impairments:  Hypomobility, Impaired sensation, Decreased activity tolerance, Decreased strength, Impaired UE functional use, Pain, Increased muscle spasms, Decreased range of motion, Improper body mechanics, Postural dysfunction, Dizziness  Visit Diagnosis: Chronic left shoulder pain  Stiffness of left shoulder, not elsewhere classified  Other symptoms and  signs involving the nervous system  Other symptoms and signs involving the musculoskeletal system     Problem List Patient Active Problem List   Diagnosis Date Noted  . Lumbar spinal stenosis 08/06/2014  Percival Spanish, PT, MPT 05/22/2018, 11:43 AM  [facility] [address]  [place], [place], [address] Phone: [phone]   Fax:  [phone]  Name: April Obrien MRN: [id number] Date of Birth: July 26, 1961

## 2018-05-29 ENCOUNTER — Ambulatory Visit: Payer: Medicaid Other | Attending: Orthopaedic Surgery | Admitting: Physical Therapy

## 2018-05-29 DIAGNOSIS — G8929 Other chronic pain: Secondary | ICD-10-CM

## 2018-05-29 DIAGNOSIS — R29898 Other symptoms and signs involving the musculoskeletal system: Secondary | ICD-10-CM | POA: Diagnosis present

## 2018-05-29 DIAGNOSIS — R29818 Other symptoms and signs involving the nervous system: Secondary | ICD-10-CM

## 2018-05-29 DIAGNOSIS — M25612 Stiffness of left shoulder, not elsewhere classified: Secondary | ICD-10-CM | POA: Diagnosis present

## 2018-05-29 DIAGNOSIS — M25512 Pain in left shoulder: Secondary | ICD-10-CM | POA: Diagnosis not present

## 2018-05-29 NOTE — Therapy (Addendum)
[facility] [address]  [place], [place], [address] Phone: [phone]   Fax:  [phone]  Physical Therapy Treatment  Patient Details  Name: April Obrien MRN: [id number] Date of Birth: 1961-05-27 Referring Provider: Joni Fears, MD   Encounter Date: 05/29/2018  PT End of Session - 05/29/18 1106    Visit Number  3    Number of Visits  4    Date for PT Re-Evaluation  05/29/18    Authorization Type  Medicaid    Authorization Time Period  05/09/18 - 05/29/18    Authorization - Visit Number  2    Authorization - Number of Visits  3    PT Start Time  1106    PT Stop Time  1148    PT Time Calculation (min)  42 min    Activity Tolerance  Patient tolerated treatment well;Patient limited by pain    Behavior During Therapy  [facility] for tasks assessed/performed       Past Medical History:  Diagnosis Date  . Barrett syndrome   . Chronic back pain   . Depression   . Diabetes mellitus without complication ([place])   . GERD (gastroesophageal reflux disease)   . Hepatitis    PMH: Hep A  . Hypercholesterolemia   . Hypertension   . Hypothyroidism   . Wears glasses     Past Surgical History:  Procedure Laterality Date  . AV FISTULA REPAIR    . BACK SURGERY    . CARPAL TUNNEL RELEASE    . COLONOSCOPY    . CYSTECTOMY    . elbow     right lateral epicondylitis  . HERNIA REPAIR    . KNEE SURGERY     arthroscopy on right knee  . SHOULDER ARTHROSCOPY     right     There were no vitals filed for this visit.  Subjective Assessment - 05/29/18 1110    Subjective  Pt reports she continues to have intense pain with almost all movements/use of L arm and does not feel that any of current therapeutic interventions have helped.    Patient is accompained by:  Family member   partner   Limitations  Lifting;House hold activities;Other (comment)    Patient Stated Goals  decrease pain, improve ROM     Currently in Pain?  Yes     Pain Score  4    up to 10/10 at worst   Pain Location  Shoulder    Pain Orientation  Left    Pain Descriptors / Indicators  Sharp;Stabbing;Constant    Pain Type  Chronic pain    Pain Radiating Towards  radicular pain down L bicep, forearm and into thumb    Pain Onset  More than a month ago    Pain Frequency  Constant    Aggravating Factors   all use of L UE    Pain Relieving Factors  rest/inactivity, hot shower, icing    Effect of Pain on Daily Activities  unable to wear a bra; difficulty driving with L hand; difficulty with ADLs including bathing and dressing; unable to lay or put pressure on L arm; increased pain with lifting or pulling    Pain Score  8    Pain Location  Neck    Pain Descriptors / Indicators  Constant;Stabbing    Pain Type  Chronic pain    Pain Radiating Towards  up into back of head    Pain Onset  More than a month ago    Pain Frequency  Constant    Aggravating Factors   looking up, turning head    Pain Relieving Factors  hot shower, icing    Effect of Pain on Daily Activities  headaches; causes dizziness; limits ability to turn head while driving         [facility] PT Assessment - 05/29/18 1106      Assessment   Medical Diagnosis  Chronic L shoulder pain     Referring Provider  Joni Fears, MD    Onset Date/Surgical Date  --   Approx 6 months ago   Hand Dominance  Right    Next MD Visit  PRN      AROM   Left Shoulder Flexion  70 Degrees   unable to go further due to intense pain   Left Shoulder ABduction  50 Degrees   unable to go further due to intense pain   Left Shoulder Internal Rotation  --   unable to initiate FIR due to pain   Left Shoulder External Rotation  --   unable to initiate FER due to pain     PROM   Left Shoulder Flexion  65 Degrees   stopped at pt request due to intense pain   Left Shoulder ABduction  56 Degrees   stopped at pt request due to intense pain   Left Shoulder Internal Rotation  58 Degrees   At ~40 degrees of  shoulder abd secondary to pain   Left Shoulder External Rotation  40 Degrees   at ~40 degrees of glenohumeral abd secondary to pain                  [facility] Adult PT Treatment/Exercise - 05/29/18 1106      Manual Therapy   Manual Therapy  Soft tissue mobilization;Myofascial release;Joint mobilization;Passive ROM    Manual therapy comments  supine    Joint Mobilization  Grade 1-2 L shoulder distraction +/- oscillations for pain relief - continued poor tolerance    Soft tissue mobilization  STM L shoulder - pecs, UT, supra & infraspinatus, teres group - pt very ttp today with poor tolerance to any pressure beyond most superficial touch    Myofascial Release  pt unable to tolerate today    Passive ROM  L shoulder all planes - increased guarding due to pain with motion in all planes limited to <50% of normal ROM             PT Education - 05/29/18 1140    Education Details  Info on home TENS unit, including edcuation in precautions and contraindications    Person(s) Educated  Patient;Other (comment)   partner   Methods  Explanation;Handout    Comprehension  Verbalized understanding          PT Long Term Goals - 05/29/18 1125      PT LONG TERM GOAL #1   Title  Pt will be independent with HEP program     Status  Achieved      PT LONG TERM GOAL #2   Title  Pt will report pain of 8/10 at worst with PROM into abduction     Status  Not Met      PT LONG TERM GOAL #3   Title  Pt will report 25% improvement in condition to improve tolerance to functional activities    Status  Not Met            Plan -  05/29/18 1137    Clinical Impression Statement  Akila continues to demonstrate poor tolerance for all therapy interventions including therapeutic exercises as well as manual therapy or PROM due to severe neck and L shoulder pain. She missed her first treatment visit following the eval due to cervical ablation but does not feel she had significant pain relief from  this and is scheduled for another ablation on the opposite side on Monday 06/03/18. Pt reports good understanding of HEP but continues to report poor tolerance for AAROM/PROM of L shoulder due to severe pain and admits to hesitancy with isometric strengthening due to apprehension of increased pain. Pain continues to limit all functional use of L UE with ADLs and daily tasks and prevents her from wearing a bra.  Pt declined use of modalities for pain during or after therapy session, but provided with info for home TENS unit as pt reports she has used one in the remote past. Given poor tolerance for therapy, no noticeable change with initial therapy attempts and Medicaid authorization period exhausted as of today's visit, will refer pt back to MD to determine further course of intervention.    Rehab Potential  Fair    Clinical Impairments Affecting Rehab Potential  Pt stated during eval that she is participating in 3 visits of PT in order to get insurance authorization for MRI     PT Treatment/Interventions  ADLs/Self Care Home Management;Cryotherapy;Electrical Stimulation;Moist Heat;Ultrasound;Functional mobility training;Therapeutic activities;Therapeutic exercise;Neuromuscular re-education;Patient/family education;Manual techniques;Passive range of motion;Dry needling;Taping;Vasopneumatic Device    PT Next Visit Plan  30 day hold pending f/u with MD    Consulted and Agree with Plan of Care  Patient       Patient will benefit from skilled therapeutic intervention in order to improve the following deficits and impairments:  Hypomobility, Impaired sensation, Decreased activity tolerance, Decreased strength, Impaired UE functional use, Pain, Increased muscle spasms, Decreased range of motion, Improper body mechanics, Postural dysfunction, Dizziness  Visit Diagnosis: Chronic left shoulder pain  Stiffness of left shoulder, not elsewhere classified  Other symptoms and signs involving the nervous  system  Other symptoms and signs involving the musculoskeletal system     Problem List Patient Active Problem List   Diagnosis Date Noted  . Lumbar spinal stenosis 08/06/2014    Percival Spanish 05/29/2018, 3:03 PM  [facility] [address]  [place], [place], [address] Phone: [phone]   Fax:  [phone]  Name: SHERRINA ZAUGG MRN: [id number] Date of Birth: 10-31-1960  PHYSICAL THERAPY DISCHARGE SUMMARY  Visits from Start of Care: 3  Current functional level related to goals / functional outcomes:   Refer to above clinical impression for status as of last visit on 05/29/18. Pt was placed on hold for 30 days and did not return to PT, therefore will proceed with discharge from PT for this episode.   Remaining deficits:   As above.   Education / Equipment:   HEP  Plan: Patient agrees to discharge.  Patient goals were not met. Patient is being discharged due to lack of progress.  ?????     Percival Spanish, PT, MPT 07/26/18, 8:24 AM  [facility] [address]  [place], [place], [address] Phone: [phone]   Fax:  [phone]

## 2018-05-29 NOTE — Patient Instructions (Signed)
TENS UNIT  This is helpful for muscle pain and spasm.   Search and Purchase a TENS 7000 2nd edition at www.tenspros.com or www.amazon.com  (It should be less than $30)     TENS unit instructions:   Do not shower or bathe with the unit on  Turn the unit off before removing electrodes or batteries  If the electrodes lose stickiness add a drop of water to the electrodes after they are disconnected from the unit and place on plastic sheet. If you continued to have difficulty, call the TENS unit company to purchase more electrodes.  Do not apply lotion on the skin area prior to use. Make sure the skin is clean and dry as this will help prolong the life of the electrodes.  After use, always check skin for unusual red areas, rash or other skin difficulties. If there are any skin problems, does not apply electrodes to the same area.  Never remove the electrodes from the unit by pulling the wires.  Do not use the TENS unit or electrodes other than as directed.  Do not change electrode placement without consulting your therapist or physician.  Keep 2 fingers with between each electrode.   TENS stands for Transcutaneous Electrical Nerve Stimulation. In other words, electrical impulses are allowed to pass through the skin in order to excite a nerve.   Purpose and Use of TENS:  TENS is a method used to manage acute and chronic pain without the use of drugs. It has been effective in managing pain associated with surgery, sprains, strains, trauma, rheumatoid arthritis, and neuralgias. It is a non-addictive, low risk, and non-invasive technique used to control pain. It is not, by any means, a curative form of treatment.   How TENS Works:  Most TENS units are a small pocket-sized unit powered by one 9 volt battery. Attached to the outside of the unit are two lead wires where two pins and/or snaps connect on each wire. All units come with a set of four reusable pads or electrodes. These are placed  on the skin surrounding the area involved. By inserting the leads into  the pads, the electricity can pass from the unit making the circuit complete.  As the intensity is turned up slowly, the electrical current enters the body from the electrodes through the skin to the surrounding nerve fibers. This triggers the release of hormones from within the body. These hormones contain pain relievers. By increasing the circulation of these hormones, the person's pain may be lessened. It is also believed that the electrical stimulation itself helps to block the pain messages being sent to the brain, thus also decreasing the body's perception of pain.   Hazards:  TENS units are NOT to be used by patients with PACEMAKERS, DEFIBRILLATORS, DIABETIC PUMPS, PREGNANT WOMEN, and patients with SEIZURE DISORDERS.  TENS units are NOT to be used over the heart, throat, brain, or spinal cord.  One of the major side effects from the TENS unit may be skin irritation. Some people may develop a rash if they are sensitive to the materials used in the electrodes or the connecting wires.   Wear the unit for up to 30 minutes at a time.   Avoid overuse due the body getting used to the stem making it not as effective over time.    

## 2018-06-10 ENCOUNTER — Other Ambulatory Visit: Payer: Self-pay | Admitting: Radiology

## 2018-06-10 ENCOUNTER — Telehealth (INDEPENDENT_AMBULATORY_CARE_PROVIDER_SITE_OTHER): Payer: Self-pay | Admitting: Orthopaedic Surgery

## 2018-06-10 DIAGNOSIS — G8929 Other chronic pain: Secondary | ICD-10-CM

## 2018-06-10 DIAGNOSIS — M25512 Pain in left shoulder: Principal | ICD-10-CM

## 2018-06-10 NOTE — Telephone Encounter (Signed)
Patient called stating she had 3 physical therapy sessions with Dr. Luis Abed who stated she would send notes to Dr. Durward Fortes to review.   Patient states her left shoulder is still painful and is requesting a return call regarding scheduling an MRI.  Patient is aware that Dr. Durward Fortes is not in the office and will return her call when he returns.

## 2018-06-10 NOTE — Telephone Encounter (Signed)
MRI ORDERED AND I NOTIFIED PT

## 2018-06-14 ENCOUNTER — Telehealth (INDEPENDENT_AMBULATORY_CARE_PROVIDER_SITE_OTHER): Payer: Self-pay | Admitting: *Deleted

## 2018-06-14 ENCOUNTER — Other Ambulatory Visit (INDEPENDENT_AMBULATORY_CARE_PROVIDER_SITE_OTHER): Payer: Self-pay | Admitting: Orthopedic Surgery

## 2018-06-14 MED ORDER — LORAZEPAM 0.5 MG PO TABS
0.5000 mg | ORAL_TABLET | Freq: Once | ORAL | 0 refills | Status: AC
Start: 2018-06-14 — End: 2018-06-14

## 2018-06-14 NOTE — Telephone Encounter (Signed)
Aurelio Brash said she would like some claustrophobia medication. Could you please have her provider Joni Fears order some before her appointment tomorrow. Thank you!  Please advise

## 2018-06-14 NOTE — Telephone Encounter (Signed)
Escribed

## 2018-06-14 NOTE — Telephone Encounter (Signed)
Please advise 

## 2018-06-14 NOTE — Telephone Encounter (Signed)
Please call the patient and advise. Thank you.

## 2018-06-14 NOTE — Telephone Encounter (Signed)
Verified pharmacy and notified pt

## 2018-06-15 ENCOUNTER — Ambulatory Visit (HOSPITAL_BASED_OUTPATIENT_CLINIC_OR_DEPARTMENT_OTHER)
Admission: RE | Admit: 2018-06-15 | Discharge: 2018-06-15 | Disposition: A | Discharge status: Home / Self Care | Payer: Medicaid Other | Source: Ambulatory Visit | Attending: Orthopaedic Surgery | Admitting: Orthopaedic Surgery

## 2018-06-15 DIAGNOSIS — G8929 Other chronic pain: Secondary | ICD-10-CM | POA: Diagnosis not present

## 2018-06-15 DIAGNOSIS — M75112 Incomplete rotator cuff tear or rupture of left shoulder, not specified as traumatic: Secondary | ICD-10-CM | POA: Diagnosis not present

## 2018-06-15 DIAGNOSIS — M7552 Bursitis of left shoulder: Secondary | ICD-10-CM | POA: Insufficient documentation

## 2018-06-15 DIAGNOSIS — M19012 Primary osteoarthritis, left shoulder: Secondary | ICD-10-CM | POA: Insufficient documentation

## 2018-06-15 DIAGNOSIS — M25512 Pain in left shoulder: Secondary | ICD-10-CM | POA: Diagnosis present

## 2018-07-01 ENCOUNTER — Other Ambulatory Visit: Payer: Self-pay | Admitting: Gastroenterology

## 2018-07-09 ENCOUNTER — Encounter (HOSPITAL_BASED_OUTPATIENT_CLINIC_OR_DEPARTMENT_OTHER): Payer: Self-pay

## 2018-07-09 ENCOUNTER — Other Ambulatory Visit: Payer: Self-pay

## 2018-07-09 ENCOUNTER — Other Ambulatory Visit: Payer: Self-pay | Admitting: Radiology

## 2018-07-09 ENCOUNTER — Emergency Department (HOSPITAL_BASED_OUTPATIENT_CLINIC_OR_DEPARTMENT_OTHER): Payer: Medicaid Other

## 2018-07-09 ENCOUNTER — Emergency Department (HOSPITAL_BASED_OUTPATIENT_CLINIC_OR_DEPARTMENT_OTHER)
Admission: EM | Admit: 2018-07-09 | Discharge: 2018-07-09 | Disposition: A | Discharge status: Home / Self Care | Payer: Medicaid Other | Attending: Emergency Medicine | Admitting: Emergency Medicine

## 2018-07-09 DIAGNOSIS — Z794 Long term (current) use of insulin: Secondary | ICD-10-CM | POA: Insufficient documentation

## 2018-07-09 DIAGNOSIS — R110 Nausea: Secondary | ICD-10-CM | POA: Insufficient documentation

## 2018-07-09 DIAGNOSIS — Z7982 Long term (current) use of aspirin: Secondary | ICD-10-CM | POA: Diagnosis not present

## 2018-07-09 DIAGNOSIS — R1010 Upper abdominal pain, unspecified: Secondary | ICD-10-CM | POA: Insufficient documentation

## 2018-07-09 DIAGNOSIS — R112 Nausea with vomiting, unspecified: Secondary | ICD-10-CM | POA: Diagnosis not present

## 2018-07-09 DIAGNOSIS — Z87891 Personal history of nicotine dependence: Secondary | ICD-10-CM | POA: Diagnosis not present

## 2018-07-09 DIAGNOSIS — M25512 Pain in left shoulder: Principal | ICD-10-CM

## 2018-07-09 DIAGNOSIS — E039 Hypothyroidism, unspecified: Secondary | ICD-10-CM | POA: Insufficient documentation

## 2018-07-09 DIAGNOSIS — I10 Essential (primary) hypertension: Secondary | ICD-10-CM | POA: Insufficient documentation

## 2018-07-09 DIAGNOSIS — E119 Type 2 diabetes mellitus without complications: Secondary | ICD-10-CM | POA: Insufficient documentation

## 2018-07-09 DIAGNOSIS — G8929 Other chronic pain: Secondary | ICD-10-CM

## 2018-07-09 DIAGNOSIS — R109 Unspecified abdominal pain: Secondary | ICD-10-CM

## 2018-07-09 DIAGNOSIS — Z79899 Other long term (current) drug therapy: Secondary | ICD-10-CM | POA: Insufficient documentation

## 2018-07-09 LAB — COMPREHENSIVE METABOLIC PANEL
ALT: 21 U/L (ref 0–44)
AST: 21 U/L (ref 15–41)
Albumin: 3.8 g/dL (ref 3.5–5.0)
Alkaline Phosphatase: 63 U/L (ref 38–126)
Anion gap: 9 (ref 5–15)
BUN: 14 mg/dL (ref 6–20)
CHLORIDE: 104 mmol/L (ref 98–111)
CO2: 25 mmol/L (ref 22–32)
CREATININE: 0.82 mg/dL (ref 0.44–1.00)
Calcium: 9.1 mg/dL (ref 8.9–10.3)
GFR calc Af Amer: >60 mL/min (ref >60)
GLUCOSE: 106 mg/dL — AB (ref 70–99)
POTASSIUM: 4 mmol/L (ref 3.5–5.1)
Sodium: 138 mmol/L (ref 135–145)
Total Bilirubin: 0.1 mg/dL — ABNORMAL LOW (ref 0.3–1.2)
Total Protein: 7.3 g/dL (ref 6.5–8.1)

## 2018-07-09 LAB — URINALYSIS, ROUTINE W REFLEX MICROSCOPIC
BILIRUBIN URINE: NEGATIVE
GLUCOSE, UA: NEGATIVE mg/dL
HGB URINE DIPSTICK: NEGATIVE
KETONES UR: NEGATIVE mg/dL
Leukocytes, UA: NEGATIVE
Nitrite: NEGATIVE
Protein, ur: NEGATIVE mg/dL
pH: 5.5 (ref 5.0–8.0)

## 2018-07-09 LAB — CBC
HEMATOCRIT: 38.3 % (ref 36.0–46.0)
HEMOGLOBIN: 12.3 g/dL (ref 12.0–15.0)
MCH: 29.4 pg (ref 26.0–34.0)
MCHC: 32.1 g/dL (ref 30.0–36.0)
MCV: 91.4 fL (ref 80.0–100.0)
Platelets: 454 10*3/uL — ABNORMAL HIGH (ref 150–400)
RBC: 4.19 MIL/uL (ref 3.87–5.11)
RDW: 12.8 % (ref 11.5–15.5)
WBC: 9.2 10*3/uL (ref 4.0–10.5)
nRBC: 0 % (ref 0.0–0.2)

## 2018-07-09 LAB — LIPASE, BLOOD: Lipase: 24 U/L (ref 11–51)

## 2018-07-09 LAB — TROPONIN I: Troponin I: <0.03 ng/mL (ref <0.03)

## 2018-07-09 MED ORDER — ONDANSETRON 8 MG PO TBDP: 8.0000 mg | ORAL_TABLET | Freq: Three times a day (TID) | ORAL | 0 refills | Status: DC | PRN

## 2018-07-09 MED ORDER — MORPHINE SULFATE (PF) 4 MG/ML IV SOLN
4.0000 mg | Freq: Once | INTRAVENOUS | Status: AC
  Administered 2018-07-09: 4 mg via INTRAVENOUS
  Filled 2018-07-09: qty 1

## 2018-07-09 MED ORDER — IOPAMIDOL (ISOVUE-300) INJECTION 61%
100.0000 mL | Freq: Once | INTRAVENOUS | Status: AC | PRN
  Administered 2018-07-09: 100 mL via INTRAVENOUS

## 2018-07-09 MED ORDER — ONDANSETRON HCL 4 MG/2ML IJ SOLN
4.0000 mg | Freq: Once | INTRAMUSCULAR | Status: AC
  Administered 2018-07-09: 4 mg via INTRAVENOUS
  Filled 2018-07-09: qty 2

## 2018-07-09 MED ORDER — SODIUM CHLORIDE 0.9 % IV BOLUS
1000.0000 mL | Freq: Once | INTRAVENOUS | Status: AC
  Administered 2018-07-09: 1000 mL via INTRAVENOUS

## 2018-07-09 NOTE — ED Notes (Signed)
Patient transported to CT 

## 2018-07-09 NOTE — ED Provider Notes (Signed)
[facility] EMERGENCY DEPARTMENT Provider Note   CSN: [id number] Arrival date & time: 07/09/18  1311     History   Chief Complaint Chief Complaint  Patient presents with  . Generalized Body Aches    HPI April Obrien is a 57 y.o. female.  HPI Patient is a 57 year old female with a history of recurrent abdominal pain.  She states she has this abdominal pain most days but today it is significantly worse.  She began having nausea and vomiting today.  She is scheduled for upcoming manometry with her gastroenterologist.  Patient denies fevers and chills.  No urinary complaints.  No blood in her vomit.  No blood in her stool.  She tried Phenergan prior to arrival without improvement in her symptoms.  Her pain is focused in her upper abdomen.  Patient's pain is severe in severity   Past Medical History:  Diagnosis Date  . Barrett syndrome   . Chronic back pain   . Depression   . Diabetes mellitus without complication ([place])   . GERD (gastroesophageal reflux disease)   . Hepatitis    PMH: Hep A  . Hypercholesterolemia   . Hypertension   . Hypothyroidism   . Wears glasses     Patient Active Problem List   Diagnosis Date Noted  . Lumbar spinal stenosis 08/06/2014    Past Surgical History:  Procedure Laterality Date  . AV FISTULA REPAIR    . BACK SURGERY    . CARPAL TUNNEL RELEASE    . CERVICAL ABLATION    . COLONOSCOPY    . CYSTECTOMY    . elbow     right lateral epicondylitis  . HERNIA REPAIR    . KNEE SURGERY     arthroscopy on right knee  . SHOULDER ARTHROSCOPY     right      OB History   None      Home Medications    Prior to Admission medications   Medication Sig Start Date End Date Taking? Authorizing Provider  Ascorbic Acid (VITAMIN C PO) Take 1 tablet by mouth daily.    [provider]  aspirin 81 MG chewable tablet Chew by mouth.    [provider]  atorvastatin (LIPITOR) 40 MG tablet TK 1 T PO  [facility] endo prescribes  09/04/16   [provider]  buPROPion (WELLBUTRIN XL) 150 MG 24 hr tablet Take by mouth. 04/04/17 07/05/17  [provider]  celecoxib (CELEBREX) 200 MG capsule Take by mouth. 07/16/17   [provider]  cholecalciferol (VITAMIN D) 1000 UNITS tablet Take 1,000 Units by mouth daily.    [provider]  CHROMIUM PO Take 1 tablet by mouth daily.    [provider]  co-enzyme Q-10 30 MG capsule Take 30 mg by mouth daily.    [provider]  cyclobenzaprine (FLEXERIL) 10 MG tablet Take 1 tablet (10 mg total) by mouth 3 (three) times daily as needed for muscle spasms. 08/07/14   Kritzer, Louie Casa, MD  dexlansoprazole (DEXILANT) 60 MG capsule Take by mouth. 05/04/17   [provider]  DULoxetine (CYMBALTA) 60 MG capsule Take one  pills at 8 AM  And one at 4 PM 04/04/17   [provider]  levothyroxine (SYNTHROID, LEVOTHROID) 112 MCG tablet Take 100 mcg by mouth daily before breakfast.     [provider]  levothyroxine (SYNTHROID, LEVOTHROID) 112 MCG tablet Take by mouth.    [provider]  lisinopril (PRINIVIL,ZESTRIL) 5 MG tablet  at bedtime. Endo prescribes 09/04/16   [provider]  Multiple Vitamin (MULTIVITAMIN WITH MINERALS) TABS tablet Take 1 tablet by mouth daily.    [provider]  NOVOLOG MIX 70/30 (70-30) 100 UNIT/ML injection INJECT 10 UNITS [place] BID 09/15/16   [provider]  Omega-3 Fatty Acids (FISH OIL PO) Take 2 capsules by mouth daily.    [provider]  omeprazole (PRILOSEC) 40 MG capsule Take 40 mg by mouth daily.    [provider]  ondansetron (ZOFRAN ODT) 8 MG disintegrating tablet Take 1 tablet (8 mg total) by mouth every 8 (eight) hours as needed for nausea or vomiting. 07/09/18   Jola Schmidt, MD  simvastatin (ZOCOR) 40 MG tablet Take 40 mg by mouth daily.    [provider]  sitaGLIPtin-metformin (JANUMET) 50-1000 MG tablet Take by mouth.     [provider]  sitaGLIPtin-metformin (JANUMET) 50-500 MG tablet 1 tablet 2 (two) times daily. Endo prescribes 10/03/16   [provider]  UNABLE TO FIND Take by mouth.    [provider]    Family History Family History  Problem Relation Age of Onset  . Diabetes Father   . Heart disease Father     Social History Social History   Tobacco Use  . Smoking status: Former Smoker    Packs/day: 1.00    Years: 26.00    Pack years: 26.00    Types: Cigarettes    Start date: 01/20/1975    Last attempt to quit: 06/25/2014    Years since quitting: 4.0  . Smokeless tobacco: Never Used  . Tobacco comment: currently usig "vape"  Substance Use Topics  . Alcohol use: Yes    Alcohol/week: 0.0 standard drinks    Comment: rare  . Drug use: No     Allergies   Oxycodone   Review of Systems Review of Systems  All other systems reviewed and are negative.    Physical Exam Updated Vital Signs BP 132/78 (BP Location: Right Arm)   Pulse 78   Temp 97.8 F (36.6 C) (Oral)   Resp 16   Ht 5\' 4"  (1.626 m)   Wt 83.5 kg   SpO2 98%   BMI 31.58 kg/m   Physical Exam  Constitutional: She is oriented to person, place, and time. She appears well-developed and well-nourished. No distress.  HENT:  Head: Normocephalic and atraumatic.  Eyes: EOM are normal.  Neck: Normal range of motion.  Cardiovascular: Normal rate, regular rhythm and normal heart sounds.  Pulmonary/Chest: Effort normal and breath sounds normal.  Abdominal: Soft. She exhibits no distension.  Upper abdominal tenderness without guarding or rebound.  Musculoskeletal: Normal range of motion.  Neurological: She is alert and oriented to person, place, and time.  Skin: Skin is warm and dry.  Psychiatric: She has a normal mood and affect. Judgment normal.  Nursing note and vitals reviewed.    ED Treatments / Results  Labs (all labs ordered are listed, but only abnormal results are displayed) Labs  Reviewed  COMPREHENSIVE METABOLIC PANEL - Abnormal; Notable for the following components:      Result Value   Glucose, Bld 106 (*)    Total Bilirubin 0.1 (*)    All other components within normal limits  CBC - Abnormal; Notable for the following components:   Platelets 454 (*)    All other components within normal limits  URINALYSIS, ROUTINE W REFLEX MICROSCOPIC - Abnormal; Notable for the following components:   Specific Gravity, Urine <  1.005 (*)    All other components within normal limits  LIPASE, BLOOD  TROPONIN I    EKG EKG Interpretation  Date/Time:  Tuesday July 09 2018 13:38:14 EDT Ventricular Rate:  87 PR Interval:    QRS Duration: 94 QT Interval:  383 QTC Calculation: 461 R Axis:   -51 Text Interpretation:  Sinus rhythm Probable left atrial enlargement Left anterior fascicular block Consider anterior infarct No significant change was found Confirmed by Jola Schmidt [phone]) on 07/09/2018 4:13:23 PM   Radiology Ct Abdomen Pelvis W Contrast  Result Date: 07/09/2018 CLINICAL DATA:  Chronic diffuse abdominal pain, acutely worsened today. EXAM: CT ABDOMEN AND PELVIS WITH CONTRAST TECHNIQUE: Multidetector CT imaging of the abdomen and pelvis was performed using the standard protocol following bolus administration of intravenous contrast. CONTRAST:  166mL ISOVUE-300 IOPAMIDOL (ISOVUE-300) INJECTION 61% COMPARISON:  CT abdomen pelvis dated August 28, 2007. FINDINGS: Lower chest: No acute abnormality. Hepatobiliary: Unchanged diffuse hepatic steatosis. No focal liver abnormality. The gallbladder is unremarkable. No biliary dilatation. Pancreas: Unremarkable. No pancreatic ductal dilatation or surrounding inflammatory changes. Spleen: Normal in size without focal abnormality. Adrenals/Urinary Tract: Adrenal glands are unremarkable. Kidneys are normal, without renal calculi, focal lesion, or hydronephrosis. Bladder is unremarkable. Stomach/Bowel: Stomach is within normal limits.  Appendix appears normal. There are a few prominent loops of small bowel in the left abdomen without evidence of obstruction. No evidence of bowel wall thickening or inflammatory changes. Mild sigmoid diverticulosis. Vascular/Lymphatic: Mild aortic atherosclerosis. No enlarged abdominal or pelvic lymph nodes. Reproductive: Uterus and bilateral adnexa are unremarkable. Other: No abdominal wall hernia or abnormality. No abdominopelvic ascites. No pneumoperitoneum. Musculoskeletal: No acute or significant osseous findings. Prior L4-L5 PLIF. Unchanged bilateral proximal sartorius intramuscular lipomas. IMPRESSION: 1.  No acute intra-abdominal process. 2. Unchanged diffuse hepatic steatosis. Electronically Signed   By: Titus Dubin M.D.   On: 07/09/2018 15:45    Procedures Procedures (including critical care time)  Medications Ordered in ED Medications  sodium chloride 0.9 % bolus 1,000 mL (0 mLs Intravenous Stopped 07/09/18 1533)  morphine 4 MG/ML injection 4 mg (4 mg Intravenous Given 07/09/18 1419)  ondansetron (ZOFRAN) injection 4 mg (4 mg Intravenous Given 07/09/18 1419)  iopamidol (ISOVUE-300) 61 % injection 100 mL (100 mLs Intravenous Contrast Given 07/09/18 1519)  morphine 4 MG/ML injection 4 mg (4 mg Intravenous Given 07/09/18 1533)     Initial Impression / Assessment and Plan / ED Course  I have reviewed the triage vital signs and the nursing notes.  Pertinent labs & imaging results that were available during my care of the patient were reviewed by me and considered in my medical decision making (see chart for details).     Patient symptoms improved in the emergency department.  Laboratory studies without significant abnormality.  CT imaging demonstrates no acute intra-abdominal pathology to explain patient's symptoms.  Close primary care and GI follow-up.  Understands to return to the ER for new or worsening symptoms.  Final Clinical Impressions(s) / ED Diagnoses   Final diagnoses:   Abdominal pain, unspecified abdominal location  Nausea    ED Discharge Orders         Ordered    ondansetron (ZOFRAN ODT) 8 MG disintegrating tablet  Every 8 hours PRN     07/09/18 1622           Jola Schmidt, MD 07/09/18 1637

## 2018-07-09 NOTE — ED Triage Notes (Signed)
C/o "pain all over" started this am-nausea "every day but worse today"-pt with similar type sx x 1 year for being seen by PCP and GI-pt to triage in w/c-NAD

## 2018-07-09 NOTE — ED Notes (Signed)
Pt took 25mg  of phenergan PTA.

## 2018-07-12 ENCOUNTER — Encounter (INDEPENDENT_AMBULATORY_CARE_PROVIDER_SITE_OTHER): Payer: Self-pay | Admitting: Orthopaedic Surgery

## 2018-07-12 ENCOUNTER — Ambulatory Visit (INDEPENDENT_AMBULATORY_CARE_PROVIDER_SITE_OTHER): Payer: Medicaid Other | Admitting: Orthopaedic Surgery

## 2018-07-12 VITALS — BP 156/89 | HR 95 | Ht 63.0 in | Wt 175.0 lb

## 2018-07-12 DIAGNOSIS — M25512 Pain in left shoulder: Secondary | ICD-10-CM | POA: Diagnosis not present

## 2018-07-12 DIAGNOSIS — G8929 Other chronic pain: Secondary | ICD-10-CM

## 2018-07-12 MED ORDER — HYDROCODONE-ACETAMINOPHEN 5-325 MG PO TABS: ORAL_TABLET | ORAL | 0 refills | Status: DC

## 2018-07-12 NOTE — Progress Notes (Signed)
Office Visit Note   Patient: April Obrien           Date of Birth: 06-30-1961           MRN: [id number] Visit Date: 07/12/2018              Requested by: Alma Friendly, MD No address on file PCP: Alma Friendly, MD   Assessment & Plan: Visit Diagnoses:  1. Chronic left shoulder pain     Plan: MRI scan left shoulder reveals partial articular and bursal surface tearing with delamination of the supraspinatus.  There is also a partial articular surface tear of the subscapularis.  Mild atrophy of the supraspinatus.  Some intrasubstance signal within the biceps tendon possible tear.  Moderate arthropathy of the acromioclavicular joint.  There is a spur directed inferiorly from the acromium.  No focal chondral defects in the glenohumeral joint.  Long discussion with April Obrien who is quite uncomfortable.  We will schedule surgery after much discussion to include an SCD, DCR, possible biceps tenodesis rotator cuff tear repair.  We will give her prescription for hydrocodone and a sling office visit over 30 minutes regarding all of the above 50% of the time in counseling.  Has had recent right shoulder surgery with an excellent result and hopefully she will have the same results on the left  Follow-Up Instructions: Return will schedule surgery left shoulder.   Orders:  No orders of the defined types were placed in this encounter.  Meds ordered this encounter  Medications  . HYDROcodone-acetaminophen (NORCO/VICODIN) 5-325 MG tablet    Sig: TAKE 1 TAB Q 6 HR    Dispense:  30 tablet    Refill:  0      Procedures: No procedures performed   Clinical Data: No additional findings.   Subjective: Chief Complaint  Patient presents with  . Follow-up    MRI REVIEW L SHOULDER     HPI  Review of Systems  Constitutional: Positive for fatigue. Negative for fever.  HENT: Negative for ear pain.   Eyes: Negative for pain.  Respiratory: Negative for cough and shortness of breath.     Cardiovascular: Negative for leg swelling.  Gastrointestinal: Negative for constipation and diarrhea.  Genitourinary: Negative for difficulty urinating.  Musculoskeletal: Negative for back pain and neck pain.  Skin: Negative for rash.  Allergic/Immunologic: Negative for food allergies.  Neurological: Positive for weakness and numbness.  Hematological: Does not bruise/bleed easily.  Psychiatric/Behavioral: Positive for sleep disturbance.     Objective: Vital Signs: BP (!) 156/89 (BP Location: Right Arm, Patient Position: Sitting, Cuff Size: Normal)   Pulse 95   Ht 5\' 3"  (1.6 m)   Wt 175 lb (79.4 kg)   BMI 31.00 kg/m   Physical Exam  Ortho Exam awake alert and oriented x3.  Comfortable sitting.  Obviously depressed because of her shoulder pain and crying throughout the office exam.  Very painful to raise her arm even to 90 degrees of abduction and flexion.  Positive impingement and empty can testing.  I think the biceps is intact.  Skin intact.  Good grip and good release  Specialty Comments:  No specialty comments available.  Imaging: No results found.   PMFS History: Patient Active Problem List   Diagnosis Date Noted  . Lumbar spinal stenosis 08/06/2014   Past Medical History:  Diagnosis Date  . Barrett syndrome   . Chronic back pain   . Depression   . Diabetes mellitus without complication ([place])   .  GERD (gastroesophageal reflux disease)   . Hepatitis    PMH: Hep A  . Hypercholesterolemia   . Hypertension   . Hypothyroidism   . Wears glasses     Family History  Problem Relation Age of Onset  . Diabetes Father   . Heart disease Father     Past Surgical History:  Procedure Laterality Date  . AV FISTULA REPAIR    . BACK SURGERY    . CARPAL TUNNEL RELEASE    . CERVICAL ABLATION    . COLONOSCOPY    . CYSTECTOMY    . elbow     right lateral epicondylitis  . HERNIA REPAIR    . KNEE SURGERY     arthroscopy on right knee  . SHOULDER ARTHROSCOPY     right     Social History   Occupational History  . Not on file  Tobacco Use  . Smoking status: Former Smoker    Packs/day: 1.00    Years: 26.00    Pack years: 26.00    Types: Cigarettes    Start date: 01/20/1975    Last attempt to quit: 06/25/2014    Years since quitting: 4.0  . Smokeless tobacco: Never Used  . Tobacco comment: currently usig "vape"  Substance and Sexual Activity  . Alcohol use: Yes    Alcohol/week: 0.0 standard drinks    Comment: rare  . Drug use: No  . Sexual activity: Not on file

## 2018-07-16 ENCOUNTER — Telehealth (INDEPENDENT_AMBULATORY_CARE_PROVIDER_SITE_OTHER): Payer: Self-pay | Admitting: Orthopaedic Surgery

## 2018-07-16 NOTE — Telephone Encounter (Signed)
Patient called stating Dr. Durward Fortes sent in a prescription of Hydrocodone to [facility] on 07/12/18.  Patient states the pharmacist told her paperwork needed to be filled out by Dr. Durward Fortes before it could be filled.  Patient is requesting a return call.

## 2018-07-16 NOTE — Telephone Encounter (Signed)
RX was for 7 1/2 days - needs PA for 7 1/2 days, RX reran for 7 days and was approved, I called patient.

## 2018-07-17 ENCOUNTER — Telehealth (INDEPENDENT_AMBULATORY_CARE_PROVIDER_SITE_OTHER): Payer: Self-pay | Admitting: Orthopaedic Surgery

## 2018-07-17 NOTE — Telephone Encounter (Signed)
Patient surgery is cancelled at [facility].  See message below:     Per Pamala Hurry we are not able to do this patients procedures on 07/25/18 for Dr. Durward Fortes due to very little reimbursement. Thank you, [place] [facility] Patient Representative   Business Office [address] [place], [place] [address] [phone]. [phone] (o)   [phone] (f)   Patient would like to reschedule at [facility]

## 2018-07-18 NOTE — Telephone Encounter (Signed)
Reschedule at [facility] Day?

## 2018-07-29 ENCOUNTER — Inpatient Hospital Stay (INDEPENDENT_AMBULATORY_CARE_PROVIDER_SITE_OTHER): Payer: Medicaid Other | Admitting: Orthopaedic Surgery

## 2018-07-31 ENCOUNTER — Encounter (HOSPITAL_COMMUNITY)
Admission: RE | Disposition: A | Discharge status: Home / Self Care | Payer: Self-pay | Source: Ambulatory Visit | Attending: Gastroenterology

## 2018-07-31 ENCOUNTER — Ambulatory Visit (HOSPITAL_COMMUNITY)
Admission: RE | Admit: 2018-07-31 | Discharge: 2018-07-31 | Disposition: A | Discharge status: Home / Self Care | Payer: Medicaid Other | Source: Ambulatory Visit | Attending: Gastroenterology | Admitting: Gastroenterology

## 2018-07-31 DIAGNOSIS — R12 Heartburn: Secondary | ICD-10-CM | POA: Insufficient documentation

## 2018-07-31 DIAGNOSIS — R1310 Dysphagia, unspecified: Secondary | ICD-10-CM | POA: Diagnosis not present

## 2018-07-31 DIAGNOSIS — R112 Nausea with vomiting, unspecified: Secondary | ICD-10-CM | POA: Insufficient documentation

## 2018-07-31 HISTORY — PX: PH IMPEDANCE STUDY: SHX5565

## 2018-07-31 HISTORY — PX: ESOPHAGEAL MANOMETRY: SHX5429

## 2018-07-31 SURGERY — MANOMETRY, ESOPHAGUS

## 2018-07-31 MED ORDER — LIDOCAINE VISCOUS HCL 2 % MT SOLN
OROMUCOSAL | Status: AC
  Filled 2018-07-31: qty 15

## 2018-07-31 SURGICAL SUPPLY — 2 items
FACESHIELD LNG OPTICON STERILE (SAFETY) IMPLANT
GLOVE BIO SURGEON STRL SZ8 (GLOVE) ×4 IMPLANT

## 2018-07-31 NOTE — Progress Notes (Signed)
Esophageal manometry performed per protocol.  Patient tolerated procedure without complications.  PH probe placed per protocol at 39cm.  Patient tolerated procedure without complications.  Patient instructed on how to use monitor and told to return at 0900 tomorrow to [facility] endoscopy department.  Dr. Michail Sermon to interpret results.

## 2018-08-01 ENCOUNTER — Encounter (HOSPITAL_COMMUNITY): Payer: Self-pay | Admitting: Gastroenterology

## 2018-08-05 NOTE — Pre-Procedure Instructions (Signed)
Khamiya Varin [place]  08/05/2018      [id number] - [place], [place] - [address] [place] [address] Phone: [phone] Fax: [phone]    Your procedure is scheduled on August 13, 2018.  Report to [facility] Admitting at 530 AM.  Call this number if you have problems the morning of surgery:  [phone]   Remember:  Do not eat or drink after midnight.   Take these medicines the morning of surgery with A SIP OF WATER  dexlansoprazole (dexilant) Fluoxetine (prozac) Levothyroxine (synthroid) Oxcarbazepine (trileptal) Ondansetron (Zofran)-if needed for nausea Hydrocodone-acetaminophen (norco)  7 days prior to surgery STOP taking any Aspirin (unless otherwise instructed by your surgeon), Aleve, Naproxen, Ibuprofen, Motrin, Advil, Goody's, BC's, all herbal medications, fish oil, and all vitamins  WHAT DO I DO ABOUT MY DIABETES MEDICATION?  Marland Kitchen Do not take oral diabetes medicines (pills) the morning of surgery-metformin (glucophage).  . THE NIGHT BEFORE SURGERY, take 35 units of lantus insulin (1/2 of your normal dose).   Reviewed and Endorsed by [facility] Patient Education Committee, August 2015   How to Manage Your Diabetes Before and After Surgery  Why is it important to control my blood sugar before and after surgery? . Improving blood sugar levels before and after surgery helps healing and can limit problems. . A way of improving blood sugar control is eating a healthy diet by: o  Eating less sugar and carbohydrates o  Increasing activity/exercise o  Talking with your doctor about reaching your blood sugar goals . High blood sugars (greater than 180 mg/dL) can raise your risk of infections and slow your recovery, so you will need to focus on controlling your diabetes during the weeks before surgery. . Make sure that the doctor who takes care of your diabetes knows about your  planned surgery including the date and location.  How do I manage my blood sugar before surgery? . Check your blood sugar at least 4 times a day, starting 2 days before surgery, to make sure that the level is not too high or low. o Check your blood sugar the morning of your surgery when you wake up and every 2 hours until you get to the Short Stay unit. . If your blood sugar is less than 70 mg/dL, you will need to treat for low blood sugar: o Do not take insulin. o Treat a low blood sugar (less than 70 mg/dL) with  cup of clear juice (cranberry or apple), 4 glucose tablets, OR glucose gel. Recheck blood sugar in 15 minutes after treatment (to make sure it is greater than 70 mg/dL). If your blood sugar is not greater than 70 mg/dL on recheck, call [phone] o  for further instructions. . Report your blood sugar to the short stay nurse when you get to Short Stay.  . If you are admitted to the hospital after surgery: o Your blood sugar will be checked by the staff and you will probably be given insulin after surgery (instead of oral diabetes medicines) to make sure you have good blood sugar levels. o The goal for blood sugar control after surgery is 80-180 mg/dL.  [facility]- Preparing For Surgery  Before surgery, you can play an important role. Because skin is not sterile, your skin needs to be as free of germs as possible. You can reduce the number of germs on your skin  by washing with CHG (chlorahexidine gluconate) Soap before surgery.  CHG is an antiseptic cleaner which kills germs and bonds with the skin to continue killing germs even after washing.    Oral Hygiene is also important to reduce your risk of infection.  Remember - BRUSH YOUR TEETH THE MORNING OF SURGERY WITH YOUR REGULAR TOOTHPASTE  Please do not use if you have an allergy to CHG or antibacterial soaps. If your skin becomes reddened/irritated stop using the CHG.  Do not shave (including legs and underarms) for at least 48  hours prior to first CHG shower. It is OK to shave your face.  Please follow these instructions carefully.   1. Shower the NIGHT BEFORE SURGERY and the MORNING OF SURGERY with CHG.   2. If you chose to wash your hair, wash your hair first as usual with your normal shampoo.  3. After you shampoo, rinse your hair and body thoroughly to remove the shampoo.  4. Use CHG as you would any other liquid soap. You can apply CHG directly to the skin and wash gently with a scrungie or a clean washcloth.   5. Apply the CHG Soap to your body ONLY FROM THE NECK DOWN.  Do not use on open wounds or open sores. Avoid contact with your eyes, ears, mouth and genitals (private parts). Wash Face and genitals (private parts)  with your normal soap.  6. Wash thoroughly, paying special attention to the area where your surgery will be performed.  7. Thoroughly rinse your body with warm water from the neck down.  8. DO NOT shower/wash with your normal soap after using and rinsing off the CHG Soap.  9. Pat yourself dry with a CLEAN TOWEL.  10. Wear CLEAN PAJAMAS to bed the night before surgery, wear comfortable clothes the morning of surgery  11. Place CLEAN SHEETS on your bed the night of your first shower and DO NOT SLEEP WITH PETS.  Day of Surgery:  Do not apply any deodorants/lotions.  Please wear clean clothes to the hospital/surgery center.   Remember to brush your teeth WITH YOUR REGULAR TOOTHPASTE.   Do not wear jewelry, make-up or nail polish.  Do not wear lotions, powders, or perfumes, or deodorant.  Do not shave 48 hours prior to surgery.   Do not bring valuables to the hospital.  [facility] is not responsible for any belongings or valuables.  Contacts, dentures or bridgework may not be worn into surgery.  Leave your suitcase in the car.  After surgery it may be brought to your room.  For patients admitted to the hospital, discharge time will be determined by your treatment team.  Patients  discharged the day of surgery will not be allowed to drive home.   Please read over the  fact sheets that you were given.

## 2018-08-05 NOTE — Progress Notes (Addendum)
PCP: Alma Friendly, MD  Cardiologist:  EKG: 07/09/18  Stress test:  ECHO:  Cardiac Cath:  Chest x-ray:

## 2018-08-06 ENCOUNTER — Inpatient Hospital Stay (HOSPITAL_COMMUNITY): Admission: RE | Admit: 2018-08-06 | Payer: Medicaid Other | Source: Ambulatory Visit
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
## 2018-08-12 ENCOUNTER — Other Ambulatory Visit: Payer: Self-pay

## 2018-08-12 ENCOUNTER — Encounter (HOSPITAL_COMMUNITY): Payer: Self-pay

## 2018-08-12 ENCOUNTER — Encounter (HOSPITAL_COMMUNITY)
Admission: RE | Admit: 2018-08-12 | Discharge: 2018-08-12 | Disposition: A | Discharge status: Home / Self Care | Payer: Medicaid Other | Source: Ambulatory Visit | Attending: Orthopaedic Surgery | Admitting: Orthopaedic Surgery

## 2018-08-12 DIAGNOSIS — Z01812 Encounter for preprocedural laboratory examination: Secondary | ICD-10-CM | POA: Insufficient documentation

## 2018-08-12 HISTORY — DX: Anxiety disorder, unspecified: F41.9

## 2018-08-12 HISTORY — DX: Personal history of other diseases of the digestive system: Z87.19

## 2018-08-12 HISTORY — DX: Unspecified rotator cuff tear or rupture of unspecified shoulder, not specified as traumatic: M75.100

## 2018-08-12 HISTORY — DX: Unspecified osteoarthritis, unspecified site: M19.90

## 2018-08-12 LAB — CBC
HCT: 40.1 % (ref 36.0–46.0)
HEMOGLOBIN: 12.7 g/dL (ref 12.0–15.0)
MCH: 29.1 pg (ref 26.0–34.0)
MCHC: 31.7 g/dL (ref 30.0–36.0)
MCV: 91.8 fL (ref 80.0–100.0)
Platelets: 459 10*3/uL — ABNORMAL HIGH (ref 150–400)
RBC: 4.37 MIL/uL (ref 3.87–5.11)
RDW: 12.5 % (ref 11.5–15.5)
WBC: 9.9 10*3/uL (ref 4.0–10.5)
nRBC: 0 % (ref 0.0–0.2)

## 2018-08-12 LAB — GLUCOSE, CAPILLARY: GLUCOSE-CAPILLARY: 219 mg/dL — AB (ref 70–99)

## 2018-08-12 LAB — BASIC METABOLIC PANEL
ANION GAP: 11 (ref 5–15)
BUN: 17 mg/dL (ref 6–20)
CHLORIDE: 101 mmol/L (ref 98–111)
CO2: 22 mmol/L (ref 22–32)
Calcium: 9.3 mg/dL (ref 8.9–10.3)
Creatinine, Ser: 0.95 mg/dL (ref 0.44–1.00)
GFR calc Af Amer: >60 mL/min (ref >60)
Glucose, Bld: 240 mg/dL — ABNORMAL HIGH (ref 70–99)
Potassium: 4 mmol/L (ref 3.5–5.1)
SODIUM: 134 mmol/L — AB (ref 135–145)

## 2018-08-12 LAB — SURGICAL PCR SCREEN
MRSA, PCR: NEGATIVE
STAPHYLOCOCCUS AUREUS: NEGATIVE

## 2018-08-12 NOTE — Progress Notes (Signed)
Pt denies SOB, chest pain, and being under the care of a cardiologist. Pt denies having an echo and cardiac cath. Requested stress test, LOV note and any cardiac studies from [facility], Cardiology, Dr. Gerarda Gunther. Pt denies recent labs. Pt denies having a chest x ray within the last year. Pt verbalized understanding of all pre-op instructions. See anesthesia note from PA.

## 2018-08-12 NOTE — Anesthesia Preprocedure Evaluation (Addendum)
Anesthesia Evaluation  Patient identified by MRN, date of birth, ID band Patient awake    Reviewed: Allergy & Precautions, NPO status , Patient's Chart, lab work & pertinent test results  Airway Mallampati: III  TM Distance: >3 FB Neck ROM: Full    Dental no notable dental hx.    Pulmonary former smoker,    Pulmonary exam normal breath sounds clear to auscultation       Cardiovascular hypertension, Pt. on medications Normal cardiovascular exam Rhythm:Regular Rate:Normal  ECG: rate 87. Sinus rhythm Probable left atrial enlargement Left anterior fascicular block   Neuro/Psych PSYCHIATRIC DISORDERS Anxiety Depression negative neurological ROS     GI/Hepatic Neg liver ROS, hiatal hernia, GERD  Medicated and Controlled,  Endo/Other  diabetes, Insulin Dependent, Oral Hypoglycemic AgentsHypothyroidism   Renal/GU negative Renal ROS     Musculoskeletal Chronic back pain   Abdominal (+) + obese,   Peds  Hematology negative hematology ROS (+) HLD   Anesthesia Other Findings left shoulder impingement, torn rotator cuff, ac arthritis, possible biceps tendinitis  Reproductive/Obstetrics                           Anesthesia Physical Anesthesia Plan  ASA: III  Anesthesia Plan: General and Regional   Post-op Pain Management: GA combined w/ Regional for post-op pain   Induction: Intravenous  PONV Risk Score and Plan: 3 and Ondansetron, Dexamethasone, Midazolam and Treatment may vary due to age or medical condition  Airway Management Planned: Oral ETT  Additional Equipment:   Intra-op Plan:   Post-operative Plan: Extubation in OR  Informed Consent: I have reviewed the patients History and Physical, chart, labs and discussed the procedure including the risks, benefits and alternatives for the proposed anesthesia with the patient or authorized representative who has indicated his/her understanding  and acceptance.   Dental advisory given  Plan Discussed with:   Anesthesia Plan Comments: (Reviewed PAT note written 08/12/2018 by Myra Gianotti, PA-C. )      Anesthesia Quick Evaluation

## 2018-08-12 NOTE — Progress Notes (Signed)
Anesthesia Chart Review:  Case:  [id number] Date/Time:  08/13/18 0700   Procedure:  LEFT SHOULDER ARTHROSCOPY WITH SUBACROMIAL DECOMPRESSION, DISTAL CLAVICLE RESECTION, MINI-OPEN ROTATOR CUFF REPAIR AND POSSIBLE BICEPS TENODESIS (Left )   Anesthesia type:  Choice   Pre-op diagnosis:  left shoulder impingement, torn rotator cuff, ac arthritis, possible biceps tendinitis   Location:  [facility] OR ROOM 07 / [place] [facility]   Surgeon:  Garald Balding, MD      DISCUSSION: Patient is a 57 year old female scheduled for the above procedure.   History includes former smoker (quit 06/25/14), GERD, hiatal hernia, DM2, hypothyroidism, hypercholesterolemia, hepatitis A. She saw Tim Lair, Tennis Ship, MD in 2015/2016 for minimal IgG Kappa MGUS. Unable to identify a monoclonal spike and no evidence of a hematologic disorder. He thought MGUS might have been reactive. He recommended PRN oncology follow-up after 11/19/10  04/2014 (Dr. Marin Olp)  She will need vitals and fasting CBG on the day of surgery. EKG from 06/2018 is stable since 2015. Last A1c < 8%. Anesthesiologist to evaluate on the day of surgery.   VS: BP 133/86   Pulse (!) 104   Temp 36.8 C (Oral)   Ht 5' 3.5" (1.613 m)   Wt 83.1 kg   SpO2 100%   BMI 31.93 kg/m  CBG 219. It does not appear that HR was rechecked at PAT. EKG in October showed NSR.   PROVIDERS: Alma Friendly, MD is PCP ([place]). Beau Fanny, MD is GI ([place]) Last seen at [facility] by Melton Alar, PA-C on 08/05/18. A1c was 7.7, down from 8.4.  He added Januvia.    LABS: Preoperative labs noted. Non-fasting glucose 240. A1c on 07/26/18 ([facility]) was 7.7 (down from 8.4 on 02/20/18). (all labs ordered are listed, but only abnormal results are displayed)  Labs Reviewed  GLUCOSE, CAPILLARY - Abnormal; Notable for the following components:      Result Value   Glucose-Capillary 219 (*)    All other components  within normal limits  BASIC METABOLIC PANEL - Abnormal; Notable for the following components:   Sodium 134 (*)    Glucose, Bld 240 (*)    All other components within normal limits  CBC - Abnormal; Notable for the following components:   Platelets 459 (*)    All other components within normal limits  SURGICAL PCR SCREEN    IMAGES: MR left shoulder 06/15/18: IMPRESSION: 1. Partial tears of the supraspinatus and infraspinatus predominantly along the articular surface though there is a component of delamination along the bursal surface of the supraspinatus as well. Associated bursitis of the subacromial and subdeltoid bursa. 2. Osteoarthritis of the [facility] joint with lateral acromial spur and changes of impingement involving the humeral head superolaterally. 3. Intrasubstance signal of the biceps tendon raising concern for intrasubstance tear and/or tendinosis. 4. Degeneration of the glenoid labrum superiorly without frank tear. Assessment is limited from lack of joint distention with fluid.   EKG: 07/09/18 (ED visit for abdominal pain with body aches): SR, probably left atrial enlargement. LAFB. Consider anterior infarct. Tracing is not significantly changed when compared to 08/04/14 tracing.   CV: N/A   Past Medical History:  Diagnosis Date  . Anxiety   . Arthritis    Acromioclavicular  . Barrett syndrome   . Chronic back pain   . Depression   . Diabetes mellitus without complication ([place])   . GERD (gastroesophageal reflux disease)   . Hepatitis  PMH: Hep A  . History of hiatal hernia   . Hypercholesterolemia   . Hypertension   . Hypothyroidism   . Torn rotator cuff    left, with impingement syndrome  . Wears glasses     Past Surgical History:  Procedure Laterality Date  . ANAL FISSURE REPAIR     s/p fissurectomy with Botox sphincterctomy 04/13/17  . BACK SURGERY    . CARPAL TUNNEL RELEASE    . CERVICAL ABLATION    . COLONOSCOPY    . CYSTECTOMY    . elbow      right lateral epicondylitis  . ESOPHAGEAL MANOMETRY N/A 07/31/2018   Procedure: ESOPHAGEAL MANOMETRY (EM);  Surgeon: Wilford Corner, MD;  Location: [facility];  Service: Endoscopy;  Laterality: N/A;  . HERNIA REPAIR    . KNEE SURGERY     arthroscopy on right knee  . [place] IMPEDANCE STUDY N/A 07/31/2018   Procedure: [place] IMPEDANCE STUDY;  Surgeon: Wilford Corner, MD;  Location: [facility];  Service: Endoscopy;  Laterality: N/A;  . SHOULDER ARTHROSCOPY     right     MEDICATIONS: . atorvastatin (LIPITOR) 80 MG tablet  . cyclobenzaprine (FLEXERIL) 10 MG tablet  . dexlansoprazole (DEXILANT) 60 MG capsule  . FLUoxetine (PROZAC) 20 MG tablet  . HYDROcodone-acetaminophen (NORCO/VICODIN) 5-325 MG tablet  . insulin glargine (LANTUS) 100 UNIT/ML injection  . levothyroxine (SYNTHROID, LEVOTHROID) 112 MCG tablet  . lisinopril (PRINIVIL,ZESTRIL) 5 MG tablet  . metFORMIN (GLUCOPHAGE) 1000 MG tablet  . ondansetron (ZOFRAN ODT) 8 MG disintegrating tablet  . ondansetron (ZOFRAN-ODT) 4 MG disintegrating tablet  . OXcarbazepine (TRILEPTAL) 150 MG tablet   No current facility-administered medications for this encounter.     George Hugh [facility] Short Stay Center/Anesthesiology Phone [phone] 08/12/2018 11:34 AM

## 2018-08-13 ENCOUNTER — Encounter (HOSPITAL_COMMUNITY): Payer: Self-pay

## 2018-08-13 ENCOUNTER — Encounter (HOSPITAL_COMMUNITY)
Admission: RE | Disposition: A | Discharge status: Home / Self Care | Payer: Self-pay | Source: Ambulatory Visit | Attending: Orthopaedic Surgery

## 2018-08-13 ENCOUNTER — Ambulatory Visit (HOSPITAL_COMMUNITY): Payer: Medicaid Other | Admitting: Anesthesiology

## 2018-08-13 ENCOUNTER — Ambulatory Visit (HOSPITAL_COMMUNITY)
Admission: RE | Admit: 2018-08-13 | Discharge: 2018-08-13 | Disposition: A | Discharge status: Home / Self Care | Payer: Medicaid Other | Source: Ambulatory Visit | Attending: Orthopaedic Surgery | Admitting: Orthopaedic Surgery

## 2018-08-13 ENCOUNTER — Ambulatory Visit (HOSPITAL_COMMUNITY): Payer: Medicaid Other | Admitting: Physician Assistant

## 2018-08-13 DIAGNOSIS — Z885 Allergy status to narcotic agent status: Secondary | ICD-10-CM | POA: Insufficient documentation

## 2018-08-13 DIAGNOSIS — E7800 Pure hypercholesterolemia, unspecified: Secondary | ICD-10-CM | POA: Diagnosis not present

## 2018-08-13 DIAGNOSIS — I10 Essential (primary) hypertension: Secondary | ICD-10-CM | POA: Diagnosis not present

## 2018-08-13 DIAGNOSIS — M7542 Impingement syndrome of left shoulder: Secondary | ICD-10-CM | POA: Diagnosis not present

## 2018-08-13 DIAGNOSIS — K219 Gastro-esophageal reflux disease without esophagitis: Secondary | ICD-10-CM | POA: Insufficient documentation

## 2018-08-13 DIAGNOSIS — M75122 Complete rotator cuff tear or rupture of left shoulder, not specified as traumatic: Secondary | ICD-10-CM | POA: Diagnosis not present

## 2018-08-13 DIAGNOSIS — M75102 Unspecified rotator cuff tear or rupture of left shoulder, not specified as traumatic: Secondary | ICD-10-CM | POA: Insufficient documentation

## 2018-08-13 DIAGNOSIS — M19012 Primary osteoarthritis, left shoulder: Secondary | ICD-10-CM | POA: Diagnosis not present

## 2018-08-13 DIAGNOSIS — E119 Type 2 diabetes mellitus without complications: Secondary | ICD-10-CM | POA: Insufficient documentation

## 2018-08-13 DIAGNOSIS — Z794 Long term (current) use of insulin: Secondary | ICD-10-CM | POA: Diagnosis not present

## 2018-08-13 DIAGNOSIS — F329 Major depressive disorder, single episode, unspecified: Secondary | ICD-10-CM | POA: Insufficient documentation

## 2018-08-13 DIAGNOSIS — M75101 Unspecified rotator cuff tear or rupture of right shoulder, not specified as traumatic: Secondary | ICD-10-CM | POA: Diagnosis present

## 2018-08-13 DIAGNOSIS — Z79899 Other long term (current) drug therapy: Secondary | ICD-10-CM | POA: Insufficient documentation

## 2018-08-13 DIAGNOSIS — E039 Hypothyroidism, unspecified: Secondary | ICD-10-CM | POA: Diagnosis not present

## 2018-08-13 DIAGNOSIS — Z87891 Personal history of nicotine dependence: Secondary | ICD-10-CM | POA: Diagnosis not present

## 2018-08-13 HISTORY — PX: SHOULDER ARTHROSCOPY WITH OPEN ROTATOR CUFF REPAIR AND DISTAL CLAVICLE ACROMINECTOMY: SHX5683

## 2018-08-13 LAB — GLUCOSE, CAPILLARY
GLUCOSE-CAPILLARY: 166 mg/dL — AB (ref 70–99)
Glucose-Capillary: 143 mg/dL — ABNORMAL HIGH (ref 70–99)

## 2018-08-13 SURGERY — SHOULDER ARTHROSCOPY WITH OPEN ROTATOR CUFF REPAIR AND DISTAL CLAVICLE ACROMINECTOMY
Anesthesia: Regional | Site: Shoulder | Laterality: Left

## 2018-08-13 MED ORDER — FENTANYL CITRATE (PF) 100 MCG/2ML IJ SOLN: 25.0000 ug | INTRAMUSCULAR | Status: DC | PRN

## 2018-08-13 MED ORDER — MIDAZOLAM HCL 5 MG/5ML IJ SOLN
INTRAMUSCULAR | Status: DC | PRN
  Administered 2018-08-13: 1 mg via INTRAVENOUS

## 2018-08-13 MED ORDER — EPHEDRINE SULFATE-NACL 50-0.9 MG/10ML-% IV SOSY
PREFILLED_SYRINGE | INTRAVENOUS | Status: DC | PRN
  Administered 2018-08-13: 5 mg via INTRAVENOUS

## 2018-08-13 MED ORDER — ROPIVACAINE HCL 7.5 MG/ML IJ SOLN
INTRAMUSCULAR | Status: DC | PRN
  Administered 2018-08-13: 20 mL via PERINEURAL

## 2018-08-13 MED ORDER — ROCURONIUM BROMIDE 50 MG/5ML IV SOSY
PREFILLED_SYRINGE | INTRAVENOUS | Status: AC
  Filled 2018-08-13: qty 5

## 2018-08-13 MED ORDER — LACTATED RINGERS IV SOLN
INTRAVENOUS | Status: DC | PRN
  Administered 2018-08-13 (×2): via INTRAVENOUS

## 2018-08-13 MED ORDER — PHENYLEPHRINE 40 MCG/ML (10ML) SYRINGE FOR IV PUSH (FOR BLOOD PRESSURE SUPPORT)
PREFILLED_SYRINGE | INTRAVENOUS | Status: AC
  Filled 2018-08-13: qty 10

## 2018-08-13 MED ORDER — LIDOCAINE 2% (20 MG/ML) 5 ML SYRINGE
INTRAMUSCULAR | Status: AC
  Filled 2018-08-13: qty 5

## 2018-08-13 MED ORDER — SODIUM CHLORIDE 0.9 % IR SOLN
Status: DC | PRN
  Administered 2018-08-13 (×3): 3000 mL

## 2018-08-13 MED ORDER — LIDOCAINE-EPINEPHRINE 1 %-1:100000 IJ SOLN
INTRAMUSCULAR | Status: AC
  Filled 2018-08-13: qty 1

## 2018-08-13 MED ORDER — EPHEDRINE 5 MG/ML INJ
INTRAVENOUS | Status: AC
  Filled 2018-08-13: qty 10

## 2018-08-13 MED ORDER — POVIDONE-IODINE 10 % EX SWAB: 2.0000 "application " | Freq: Once | CUTANEOUS | Status: DC

## 2018-08-13 MED ORDER — CEFAZOLIN SODIUM-DEXTROSE 2-4 GM/100ML-% IV SOLN
2.0000 g | INTRAVENOUS | Status: AC
  Administered 2018-08-13: 2 g via INTRAVENOUS
  Filled 2018-08-13: qty 100

## 2018-08-13 MED ORDER — 0.9 % SODIUM CHLORIDE (POUR BTL) OPTIME
TOPICAL | Status: DC | PRN
  Administered 2018-08-13: 1000 mL

## 2018-08-13 MED ORDER — ONDANSETRON HCL 4 MG/2ML IJ SOLN
INTRAMUSCULAR | Status: DC | PRN
  Administered 2018-08-13: 4 mg via INTRAVENOUS

## 2018-08-13 MED ORDER — PHENYLEPHRINE 40 MCG/ML (10ML) SYRINGE FOR IV PUSH (FOR BLOOD PRESSURE SUPPORT)
PREFILLED_SYRINGE | INTRAVENOUS | Status: DC | PRN
  Administered 2018-08-13 (×2): 80 ug via INTRAVENOUS
  Administered 2018-08-13: 120 ug via INTRAVENOUS

## 2018-08-13 MED ORDER — CLONIDINE HCL (ANALGESIA) 100 MCG/ML EP SOLN
EPIDURAL | Status: DC | PRN
  Administered 2018-08-13: 100 ug

## 2018-08-13 MED ORDER — SODIUM CHLORIDE 0.9 % IV SOLN
INTRAVENOUS | Status: DC | PRN
  Administered 2018-08-13: 60 ug/min via INTRAVENOUS
  Administered 2018-08-13: 90 ug/min via INTRAVENOUS

## 2018-08-13 MED ORDER — CHLORHEXIDINE GLUCONATE 4 % EX LIQD: 60.0000 mL | Freq: Once | CUTANEOUS | Status: DC

## 2018-08-13 MED ORDER — FENTANYL CITRATE (PF) 250 MCG/5ML IJ SOLN
INTRAMUSCULAR | Status: AC
  Filled 2018-08-13: qty 5

## 2018-08-13 MED ORDER — ROCURONIUM BROMIDE 50 MG/5ML IV SOSY
PREFILLED_SYRINGE | INTRAVENOUS | Status: DC | PRN
  Administered 2018-08-13: 50 mg via INTRAVENOUS

## 2018-08-13 MED ORDER — LIDOCAINE 2% (20 MG/ML) 5 ML SYRINGE
INTRAMUSCULAR | Status: DC | PRN
  Administered 2018-08-13: 40 mg via INTRAVENOUS

## 2018-08-13 MED ORDER — DEXAMETHASONE SODIUM PHOSPHATE 10 MG/ML IJ SOLN
INTRAMUSCULAR | Status: AC
  Filled 2018-08-13: qty 1

## 2018-08-13 MED ORDER — HYDROCODONE-ACETAMINOPHEN 5-325 MG PO TABS: 1.0000 | ORAL_TABLET | ORAL | 0 refills | Status: DC | PRN

## 2018-08-13 MED ORDER — MIDAZOLAM HCL 2 MG/2ML IJ SOLN
INTRAMUSCULAR | Status: AC
  Filled 2018-08-13: qty 2

## 2018-08-13 MED ORDER — PROPOFOL 10 MG/ML IV BOLUS
INTRAVENOUS | Status: AC
  Filled 2018-08-13: qty 20

## 2018-08-13 MED ORDER — BUPIVACAINE HCL (PF) 0.25 % IJ SOLN
INTRAMUSCULAR | Status: AC
  Filled 2018-08-13: qty 30

## 2018-08-13 MED ORDER — ONDANSETRON HCL 4 MG/2ML IJ SOLN: 4.0000 mg | Freq: Once | INTRAMUSCULAR | Status: DC | PRN

## 2018-08-13 MED ORDER — DEXAMETHASONE SODIUM PHOSPHATE 10 MG/ML IJ SOLN
INTRAMUSCULAR | Status: DC | PRN
  Administered 2018-08-13: 6 mg via INTRAVENOUS

## 2018-08-13 MED ORDER — SUGAMMADEX SODIUM 200 MG/2ML IV SOLN
INTRAVENOUS | Status: DC | PRN
  Administered 2018-08-13: 200 mg via INTRAVENOUS

## 2018-08-13 MED ORDER — PROPOFOL 10 MG/ML IV BOLUS
INTRAVENOUS | Status: DC | PRN
  Administered 2018-08-13: 150 mg via INTRAVENOUS

## 2018-08-13 MED ORDER — ONDANSETRON HCL 4 MG/2ML IJ SOLN
INTRAMUSCULAR | Status: AC
  Filled 2018-08-13: qty 2

## 2018-08-13 MED ORDER — FENTANYL CITRATE (PF) 100 MCG/2ML IJ SOLN
INTRAMUSCULAR | Status: DC | PRN
  Administered 2018-08-13 (×2): 50 ug via INTRAVENOUS

## 2018-08-13 MED ORDER — SODIUM CHLORIDE 0.9 % IV SOLN: 75.0000 mL/h | INTRAVENOUS | Status: DC

## 2018-08-13 SURGICAL SUPPLY — 64 items
AID PSTN UNV HD RSTRNT DISP (MISCELLANEOUS) ×1
ANCH SUT KNTLS SLF PNCH MTL (Anchor) ×1 IMPLANT
ANCHOR PEEK ALL THREAD (Anchor) ×1 IMPLANT
APL SKNCLS STERI-STRIP NONHPOA (GAUZE/BANDAGES/DRESSINGS) ×1
BENZOIN TINCTURE PRP APPL 2/3 (GAUZE/BANDAGES/DRESSINGS) ×2 IMPLANT
BLADE GREAT WHITE 4.2 (BLADE) ×1 IMPLANT
BLADE SURG 11 STRL SS (BLADE) ×2 IMPLANT
BUR OVAL 6.0 (BURR) ×2 IMPLANT
CANNULA ACUFLEX KIT 5X76 (CANNULA) ×2 IMPLANT
CLSR STERI-STRIP ANTIMIC 1/2X4 (GAUZE/BANDAGES/DRESSINGS) ×1 IMPLANT
COVER SURGICAL LIGHT HANDLE (MISCELLANEOUS) ×2 IMPLANT
COVER WAND RF STERILE (DRAPES) ×2 IMPLANT
DRAPE SHOULDER BEACH CHAIR (DRAPES) ×2 IMPLANT
DRSG PAD ABDOMINAL 8X10 ST (GAUZE/BANDAGES/DRESSINGS) ×2 IMPLANT
DURAPREP 26ML APPLICATOR (WOUND CARE) ×2 IMPLANT
ELECT NDL TIP 2.8 STRL (NEEDLE) ×1 IMPLANT
ELECT NEEDLE TIP 2.8 STRL (NEEDLE) ×2 IMPLANT
ELECT REM PT RETURN 9FT ADLT (ELECTROSURGICAL) ×2
ELECTRODE REM PT RTRN 9FT ADLT (ELECTROSURGICAL) ×1 IMPLANT
GAUZE SPONGE 4X4 12PLY STRL (GAUZE/BANDAGES/DRESSINGS) ×2 IMPLANT
GLOVE BIOGEL PI IND STRL 8 (GLOVE) ×1 IMPLANT
GLOVE BIOGEL PI IND STRL 8.5 (GLOVE) ×1 IMPLANT
GLOVE BIOGEL PI INDICATOR 8 (GLOVE) ×1
GLOVE BIOGEL PI INDICATOR 8.5 (GLOVE) ×1
GLOVE ECLIPSE 8.0 STRL XLNG CF (GLOVE) ×2 IMPLANT
GLOVE ECLIPSE 8.5 STRL (GLOVE) ×4 IMPLANT
GOWN STRL REUS W/ TWL LRG LVL3 (GOWN DISPOSABLE) ×2 IMPLANT
GOWN STRL REUS W/TWL 2XL LVL3 (GOWN DISPOSABLE) ×2 IMPLANT
GOWN STRL REUS W/TWL LRG LVL3 (GOWN DISPOSABLE) ×4
IMPL ANCHOR PEEK 4.5MM (Anchor) IMPLANT
IMPLANT ANCHOR PEEK 4.5MM (Anchor) ×2 IMPLANT
IV NS IRRIG 3000ML ARTHROMATIC (IV SOLUTION) ×3 IMPLANT
KIT TURNOVER KIT B (KITS) ×2 IMPLANT
MANIFOLD NEPTUNE II (INSTRUMENTS) ×2 IMPLANT
NDL SPNL 18GX3.5 QUINCKE PK (NEEDLE) ×1 IMPLANT
NDL SUT 6 .5 CRC .975X.05 MAYO (NEEDLE) IMPLANT
NEEDLE 22X1 1/2 (OR ONLY) (NEEDLE) IMPLANT
NEEDLE MAYO TAPER (NEEDLE)
NEEDLE SPNL 18GX3.5 QUINCKE PK (NEEDLE) ×2 IMPLANT
NS IRRIG 1000ML POUR BTL (IV SOLUTION) ×2 IMPLANT
PACK SHOULDER (CUSTOM PROCEDURE TRAY) ×2 IMPLANT
PAD ARMBOARD 7.5X6 YLW CONV (MISCELLANEOUS) ×4 IMPLANT
PROBE BIPOLAR ATHRO 135MM 90D (MISCELLANEOUS) ×2 IMPLANT
RESTRAINT HEAD UNIVERSAL NS (MISCELLANEOUS) ×1 IMPLANT
SET ARTHROSCOPY TUBING (MISCELLANEOUS) ×2
SET ARTHROSCOPY TUBING LN (MISCELLANEOUS) ×1 IMPLANT
SLING ARM IMMOBILIZER LRG (SOFTGOODS) ×1 IMPLANT
SPONGE LAP 4X18 RFD (DISPOSABLE) ×2 IMPLANT
STRIP CLOSURE SKIN 1/2X4 (GAUZE/BANDAGES/DRESSINGS) ×2 IMPLANT
SUCTION FRAZIER TIP 10 FR DISP (SUCTIONS) ×2 IMPLANT
SUT ETHIBOND 0 MO6 C/R (SUTURE) IMPLANT
SUT ETHIBOND 2 0 SH (SUTURE) IMPLANT
SUT ETHIBOND 2 OS 4 DA (SUTURE) ×2 IMPLANT
SUT ETHILON 4 0 PS 2 18 (SUTURE) IMPLANT
SUT MNCRL AB 3-0 PS2 18 (SUTURE) ×2 IMPLANT
SUT VIC AB 0 CT1 27 (SUTURE) ×2
SUT VIC AB 0 CT1 27XBRD ANBCTR (SUTURE) ×1 IMPLANT
SUT VIC AB 2-0 CT1 27 (SUTURE) ×2
SUT VIC AB 2-0 CT1 TAPERPNT 27 (SUTURE) IMPLANT
SUT VICRYL 0 UR6 27IN ABS (SUTURE) ×2 IMPLANT
SYR CONTROL 10ML LL (SYRINGE) ×1 IMPLANT
TOWEL OR 17X26 10 PK STRL BLUE (TOWEL DISPOSABLE) ×2 IMPLANT
TUBE CONNECTING 12X1/4 (SUCTIONS) ×4 IMPLANT
WATER STERILE IRR 1000ML POUR (IV SOLUTION) ×2 IMPLANT

## 2018-08-13 NOTE — Transfer of Care (Signed)
Immediate Anesthesia Transfer of Care Note  Patient: April Obrien  Procedure(s) Performed: LEFT SHOULDER ARTHROSCOPY WITH SUBACROMIAL DECOMPRESSION, DISTAL CLAVICLE RESECTION, MINI-OPEN ROTATOR CUFF REPAIR AND POSSIBLE BICEPS TENODESIS (Left Shoulder)  Patient Location: PACU  Anesthesia Type:General and Regional  Level of Consciousness: awake, oriented and patient cooperative  Airway & Oxygen Therapy: Patient Spontanous Breathing and Patient connected to face mask oxygen  Post-op Assessment: Report given to RN and Post -op Vital signs reviewed and stable  Post vital signs: Reviewed and stable  Last Vitals:  Vitals Value Taken Time  BP 100/47 08/13/2018  9:13 AM  Temp    Pulse 90 08/13/2018  9:14 AM  Resp 13 08/13/2018  9:14 AM  SpO2 98 % 08/13/2018  9:14 AM  Vitals shown include unvalidated device data.  Last Pain:  Vitals:   08/13/18 0602  PainSc: 7       Patients Stated Pain Goal: 3 (95/74/73 4037)  Complications: No apparent anesthesia complications

## 2018-08-13 NOTE — Anesthesia Postprocedure Evaluation (Signed)
Anesthesia Post Note  Patient: April Obrien  Procedure(s) Performed: LEFT SHOULDER ARTHROSCOPY WITH SUBACROMIAL DECOMPRESSION, DISTAL CLAVICLE RESECTION, MINI-OPEN ROTATOR CUFF REPAIR AND POSSIBLE BICEPS TENODESIS (Left Shoulder)     Patient location during evaluation: PACU Anesthesia Type: Regional and General Level of consciousness: awake and alert Pain management: pain level controlled Vital Signs Assessment: post-procedure vital signs reviewed and stable Respiratory status: spontaneous breathing, nonlabored ventilation, respiratory function stable and patient connected to nasal cannula oxygen Cardiovascular status: blood pressure returned to baseline and stable Postop Assessment: no apparent nausea or vomiting Anesthetic complications: no    Last Vitals:  Vitals:   08/13/18 1100 08/13/18 1105  BP: 100/64 100/71  Pulse: 92 94  Resp: 12   Temp:    SpO2: 97% 100%    Last Pain:  Vitals:   08/13/18 1105  PainSc: 0-No pain                 Ambri Miltner P Suhaylah Wampole

## 2018-08-13 NOTE — Progress Notes (Signed)
The recent History & Physical has been reviewed. I have personally examined the patient today. There is no interval change to the documented History & Physical. The patient would like to proceed with the procedure.  Garald Balding 08/13/2018,  7:07 AM  Patient ID: April Obrien, female   DOB: 11/02/1960, 57 y.o.   MRN: [id number]

## 2018-08-13 NOTE — Progress Notes (Signed)
Spoke with dr ellender/mda re" sbp 80's-90's ( perop 130's140's) after  receining 1000 bolus in pacu + 1050 in or. Will give an additional 1033mls lr and f/u with him

## 2018-08-13 NOTE — Anesthesia Procedure Notes (Signed)
Procedure Name: Intubation Date/Time: 08/13/2018 7:26 AM Performed by: Orlie Dakin, CRNA Pre-anesthesia Checklist: Patient identified, Emergency Drugs available, Suction available and Patient being monitored Patient Re-evaluated:Patient Re-evaluated prior to induction Oxygen Delivery Method: Circle system utilized Preoxygenation: Pre-oxygenation with 100% oxygen Induction Type: IV induction Ventilation: Mask ventilation without difficulty and Oral airway inserted - appropriate to patient size Laryngoscope Size: 3 and Miller Grade View: Grade I Tube type: Oral Tube size: 7.0 mm Number of attempts: 1 Airway Equipment and Method: Stylet Placement Confirmation: ETT inserted through vocal cords under direct vision,  positive ETCO2 and breath sounds checked- equal and bilateral Secured at: 22 cm Tube secured with: Tape Dental Injury: Teeth and Oropharynx as per pre-operative assessment  Comments: Noted "rough" end of right upper front tooth in Short-Stay.   4x4s bite block used after intubation.

## 2018-08-13 NOTE — Progress Notes (Signed)
bp 102/64 after add'l liter of  IVF per dr ellender/ she is ok to d/c
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
## 2018-08-13 NOTE — Progress Notes (Signed)
PATIENT ID:      April Obrien  MRN:     [id number] DOB/AGE:    58-May-1962 / 58 y.o.       OPERATIVE REPORT    DATE OF PROCEDURE:  08/13/2018       PREOPERATIVE DIAGNOSIS:   Impingement syndrome, a-c joint degenerative arthritis, rotator cuff tear left shoulder                                                       Estimated body mass index is 31.93 kg/m as calculated from the following:   Height as of 08/12/18: 5' 3.5" (1.613 m).   Weight as of 08/12/18: 83.1 kg.     POSTOPERATIVE DIAGNOSIS: same                                                                    Estimated body mass index is 31.93 kg/m as calculated from the following:   Height as of 08/12/18: 5' 3.5" (1.613 m).   Weight as of 08/12/18: 83.1 kg.     PROCEDURE:  Procedure(s): LEFT SHOULDER ARTHROSCOPY WITH SUBACROMIAL DECOMPRESSION, DISTAL CLAVICLE RESECTION, MINI-OPEN ROTATOR CUFF REPAIR      SURGEON:  Joni Fears, MD    ASSISTANT:   Biagio Borg, PA-C   (Present and scrubbed throughout the case, critical for assistance with exposure, retraction, instrumentation, and closure.)          ANESTHESIA: regional and general     DRAINS: none :      TOURNIQUET TIME: * No tourniquets in log *    COMPLICATIONS:  None   CONDITION:  stable  PROCEDURE IN DETAIL: [place] 08/13/2018, 8:46 AM  Patient ID: April Obrien, female   DOB: 1961-01-08, 57 y.o.   MRN: [id number]

## 2018-08-13 NOTE — Anesthesia Procedure Notes (Signed)
Anesthesia Regional Block: Interscalene brachial plexus block   Pre-Anesthetic Checklist: ,, timeout performed, Correct Patient, Correct Site, Correct Laterality, Correct Procedure, Correct Position, site marked, Risks and benefits discussed,  Surgical consent,  Pre-op evaluation,  At surgeon's request and post-op pain management  Laterality: Left  Prep: chloraprep       Needles:  Injection technique: Single-shot  Needle Type: Echogenic Stimulator Needle     Needle Length: 9cm  Needle Gauge: 21     Additional Needles:   Procedures:,,,, ultrasound used (permanent image in chart),,,,  Narrative:  Start time: 08/13/2018 7:00 AM End time: 08/13/2018 7:10 AM Injection made incrementally with aspirations every 5 mL.  Performed by: Personally  Anesthesiologist: Murvin Natal, MD  Additional Notes: Functioning IV was confirmed and monitors were applied.  A timeout was performed. Sterile prep, hand hygiene and sterile gloves were used. A 33mm 21ga Arrow echogenic stimulator needle was used. Negative aspiration and negative test dose prior to incremental administration of local anesthetic. The patient tolerated the procedure well.  Ultrasound guidance: relevent anatomy identified, needle position confirmed, local anesthetic spread visualized around nerve(s), vascular puncture avoided.  Image printed for medical record.

## 2018-08-14 ENCOUNTER — Telehealth (INDEPENDENT_AMBULATORY_CARE_PROVIDER_SITE_OTHER): Payer: Self-pay | Admitting: Orthopaedic Surgery

## 2018-08-14 ENCOUNTER — Encounter (HOSPITAL_COMMUNITY): Payer: Self-pay | Admitting: Orthopaedic Surgery

## 2018-08-14 NOTE — Telephone Encounter (Signed)
April Obrien called stating April Obrien has not been able to pick up her prescription of Hydrocodone from [facility] because they told her due to her insurance a prior authorization is required.  April Obrien states April Obrien is in a lot of pain and would like to pick up the prescription ASAP.

## 2018-08-14 NOTE — Telephone Encounter (Signed)
Spoke with pharmacist who verified pt has picked up rx

## 2018-08-14 NOTE — Telephone Encounter (Signed)
I called patient, patient has hydrocodone

## 2018-08-19 ENCOUNTER — Encounter (INDEPENDENT_AMBULATORY_CARE_PROVIDER_SITE_OTHER): Payer: Self-pay | Admitting: Orthopaedic Surgery

## 2018-08-19 ENCOUNTER — Ambulatory Visit (INDEPENDENT_AMBULATORY_CARE_PROVIDER_SITE_OTHER): Payer: Medicaid Other | Admitting: Orthopaedic Surgery

## 2018-08-19 VITALS — BP 116/95 | HR 100

## 2018-08-19 DIAGNOSIS — G8929 Other chronic pain: Secondary | ICD-10-CM

## 2018-08-19 DIAGNOSIS — M25512 Pain in left shoulder: Secondary | ICD-10-CM

## 2018-08-19 NOTE — Progress Notes (Signed)
Office Visit Note   Patient: April Obrien           Date of Birth: 03/17/1961           MRN: [id number] Visit Date: 08/19/2018              Requested by: Alma Friendly, MD [address] [place], [place] [address] PCP: Alma Friendly, MD   Assessment & Plan: Visit Diagnoses:  1. Chronic left shoulder pain     Plan: 4 days status post arthroscopic SCD, DCR and mini open rotator cuff tear repair.  Doing well.  Only Advil for pain.  Wounds are clean and dry.  New Steri-Strips.  Instruction on circumduction exercises.  Wear the sling.  Office 1 week  Follow-Up Instructions: Return in about 1 week (around 08/26/2018).   Orders:  No orders of the defined types were placed in this encounter.  No orders of the defined types were placed in this encounter.     Procedures: No procedures performed   Clinical Data: No additional findings.   Subjective: Chief Complaint  Patient presents with  . Left Shoulder - Pain, Routine Post Op    Patient comes in today for her 1st post op check. POST OP-L SHOULDER  (surgery date 08-13-18) Some pain. Taking ibuprofen for pain and Norco PRN. Wears sling. Feels nauseous. Would like something to help some feeling nauseous. Inscion looks look well.     HPI  Review of Systems   Objective: Vital Signs: BP (!) 116/95   Pulse 100   Physical Exam  Ortho Exam left shoulder wound healing without obvious problem.  Neurovascular exam intact.  Mild ecchymosis  Specialty Comments:  No specialty comments available.  Imaging: No results found.   PMFS History: Patient Active Problem List   Diagnosis Date Noted  . Unspecified rotator cuff tear or rupture of right shoulder, not specified as traumatic 08/13/2018  . Impingement syndrome of left shoulder 08/13/2018  . Osteoarthritis of left AC (acromioclavicular) joint 08/13/2018  . Lumbar spinal stenosis 08/06/2014   Past Medical History:  Diagnosis Date  . Anxiety   . Arthritis     Acromioclavicular  . Barrett syndrome   . Chronic back pain   . Depression   . Diabetes mellitus without complication ([place])   . GERD (gastroesophageal reflux disease)   . Hepatitis    PMH: Hep A  . History of hiatal hernia   . Hypercholesterolemia   . Hypertension   . Hypothyroidism   . Torn rotator cuff    left, with impingement syndrome  . Wears glasses     Family History  Problem Relation Age of Onset  . Diabetes Father   . Heart disease Father     Past Surgical History:  Procedure Laterality Date  . ANAL FISSURE REPAIR     s/p fissurectomy with Botox sphincterctomy 04/13/17  . BACK SURGERY    . CARPAL TUNNEL RELEASE    . CERVICAL ABLATION    . COLONOSCOPY    . CYSTECTOMY    . elbow     right lateral epicondylitis  . ESOPHAGEAL MANOMETRY N/A 07/31/2018   Procedure: ESOPHAGEAL MANOMETRY (EM);  Surgeon: Wilford Corner, MD;  [facility];  Service: Endoscopy;  Laterality: N/A;  . HERNIA REPAIR    . KNEE SURGERY     arthroscopy on right knee  . [place] IMPEDANCE STUDY N/A 07/31/2018   Procedure: [place] IMPEDANCE STUDY;  Surgeon: Wilford Corner, MD;  [facility];  Service: Endoscopy;  Laterality: N/A;  . SHOULDER ARTHROSCOPY     right   . SHOULDER ARTHROSCOPY WITH OPEN ROTATOR CUFF REPAIR AND DISTAL CLAVICLE ACROMINECTOMY Left 08/13/2018   Procedure: LEFT SHOULDER ARTHROSCOPY WITH SUBACROMIAL DECOMPRESSION, DISTAL CLAVICLE RESECTION, MINI-OPEN ROTATOR CUFF REPAIR AND POSSIBLE BICEPS TENODESIS;  Surgeon: Garald Balding, MD;  Location: [facility];  Service: Orthopedics;  Laterality: Left;   Social History   Occupational History  . Not on file  Tobacco Use  . Smoking status: Former Smoker    Packs/day: 1.00    Years: 26.00    Pack years: 26.00    Types: Cigarettes    Start date: 01/20/1975    Last attempt to quit: 06/25/2014    Years since quitting: 4.1  . Smokeless tobacco: Never Used  Substance and Sexual Activity  . Alcohol use: Yes     Alcohol/week: 0.0 standard drinks    Comment: rare  . Drug use: Yes    Types: Marijuana    Comment: last use October 2019  . Sexual activity: Not on file     Garald Balding, MD   Note - This record has been created using [employer].  Chart creation errors have been sought, but may not always  have been located. Such creation errors do not reflect on  the standard of medical care.

## 2018-08-27 ENCOUNTER — Encounter (INDEPENDENT_AMBULATORY_CARE_PROVIDER_SITE_OTHER): Payer: Self-pay | Admitting: Orthopaedic Surgery

## 2018-08-27 ENCOUNTER — Ambulatory Visit (INDEPENDENT_AMBULATORY_CARE_PROVIDER_SITE_OTHER): Payer: Medicaid Other | Admitting: Orthopaedic Surgery

## 2018-08-27 DIAGNOSIS — M25512 Pain in left shoulder: Secondary | ICD-10-CM

## 2018-08-27 DIAGNOSIS — G8929 Other chronic pain: Secondary | ICD-10-CM

## 2018-08-27 NOTE — Progress Notes (Signed)
Office Visit Note   Patient: April Obrien           Date of Birth: 04-21-1961           MRN: [id number] Visit Date: 08/27/2018              Requested by: Alma Friendly, MD No address on file PCP: Alma Friendly, MD   Assessment & Plan: Visit Diagnoses:  1. Chronic left shoulder pain     Plan: 12 days status post rotator cuff tear repair left shoulder and doing well.  Will start physical therapy and have her return in 2 weeks.  Passive range of motion for at least a month.  Continue with the sling Follow-Up Instructions: Return in about 2 weeks (around 09/10/2018).   Orders:  Orders Placed This Encounter  Procedures  . Ambulatory referral to Physical Therapy   No orders of the defined types were placed in this encounter.     Procedures: No procedures performed   Clinical Data: No additional findings.   Subjective: No chief complaint on file. 12 days post rotator cuff tear repair left shoulder.  Had some pain in her left shoulder trying to pull up her pants without using the sling several days ago but better today.  HPI  Review of Systems   Objective: Vital Signs: There were no vitals taken for this visit.  Physical Exam  Ortho Exam shoulder wound healing without problem.  Skin intact.  Neurologically intact. Specialty Comments:  No specialty comments available.  Imaging: No results found.   PMFS History: Patient Active Problem List   Diagnosis Date Noted  . Unspecified rotator cuff tear or rupture of right shoulder, not specified as traumatic 08/13/2018  . Impingement syndrome of left shoulder 08/13/2018  . Osteoarthritis of left AC (acromioclavicular) joint 08/13/2018  . Lumbar spinal stenosis 08/06/2014   Past Medical History:  Diagnosis Date  . Anxiety   . Arthritis    Acromioclavicular  . Barrett syndrome   . Chronic back pain   . Depression   . Diabetes mellitus without complication ([place])   . GERD (gastroesophageal reflux  disease)   . Hepatitis    PMH: Hep A  . History of hiatal hernia   . Hypercholesterolemia   . Hypertension   . Hypothyroidism   . Torn rotator cuff    left, with impingement syndrome  . Wears glasses     Family History  Problem Relation Age of Onset  . Diabetes Father   . Heart disease Father     Past Surgical History:  Procedure Laterality Date  . ANAL FISSURE REPAIR     s/p fissurectomy with Botox sphincterctomy 04/13/17  . BACK SURGERY    . CARPAL TUNNEL RELEASE    . CERVICAL ABLATION    . COLONOSCOPY    . CYSTECTOMY    . elbow     right lateral epicondylitis  . ESOPHAGEAL MANOMETRY N/A 07/31/2018   Procedure: ESOPHAGEAL MANOMETRY (EM);  Surgeon: Wilford Corner, MD;  Location: [facility];  Service: Endoscopy;  Laterality: N/A;  . HERNIA REPAIR    . KNEE SURGERY     arthroscopy on right knee  . [place] IMPEDANCE STUDY N/A 07/31/2018   Procedure: [place] IMPEDANCE STUDY;  Surgeon: Wilford Corner, MD;  [facility]: [facility];  Service: Endoscopy;  Laterality: N/A;  . SHOULDER ARTHROSCOPY     right   . SHOULDER ARTHROSCOPY WITH OPEN ROTATOR CUFF REPAIR AND DISTAL CLAVICLE ACROMINECTOMY Left 08/13/2018   Procedure:  LEFT SHOULDER ARTHROSCOPY WITH SUBACROMIAL DECOMPRESSION, DISTAL CLAVICLE RESECTION, MINI-OPEN ROTATOR CUFF REPAIR AND POSSIBLE BICEPS TENODESIS;  Surgeon: Garald Balding, MD;  Location: [facility];  Service: Orthopedics;  Laterality: Left;   Social History   Occupational History  . Not on file  Tobacco Use  . Smoking status: Former Smoker    Packs/day: 1.00    Years: 26.00    Pack years: 26.00    Types: Cigarettes    Start date: 01/20/1975    Last attempt to quit: 06/25/2014    Years since quitting: 4.1  . Smokeless tobacco: Never Used  Substance and Sexual Activity  . Alcohol use: Yes    Alcohol/week: 0.0 standard drinks    Comment: rare  . Drug use: Yes    Types: Marijuana    Comment: last use October 2019  . Sexual activity: Not on file     Garald Balding, MD   Note - This record has been created using [employer].  Chart creation errors have been sought, but may not always  have been located. Such creation errors do not reflect on  the standard of medical care.

## 2018-09-02 ENCOUNTER — Telehealth (INDEPENDENT_AMBULATORY_CARE_PROVIDER_SITE_OTHER): Payer: Self-pay | Admitting: Orthopaedic Surgery

## 2018-09-02 NOTE — Telephone Encounter (Signed)
[facility] will call patient to schedule appt, IC patient with number to [facility]

## 2018-09-02 NOTE — Telephone Encounter (Signed)
Patient called checking on the status of her referral to physical therapy in [facility].  Patient requested a return call.

## 2018-09-04 ENCOUNTER — Other Ambulatory Visit: Payer: Self-pay

## 2018-09-04 ENCOUNTER — Ambulatory Visit: Payer: Medicaid Other | Attending: Orthopaedic Surgery | Admitting: Physical Therapy

## 2018-09-04 ENCOUNTER — Encounter: Payer: Self-pay | Admitting: Physical Therapy

## 2018-09-04 DIAGNOSIS — R29898 Other symptoms and signs involving the musculoskeletal system: Secondary | ICD-10-CM | POA: Diagnosis present

## 2018-09-04 DIAGNOSIS — M6281 Muscle weakness (generalized): Secondary | ICD-10-CM

## 2018-09-04 DIAGNOSIS — M25512 Pain in left shoulder: Secondary | ICD-10-CM

## 2018-09-04 DIAGNOSIS — M25612 Stiffness of left shoulder, not elsewhere classified: Secondary | ICD-10-CM | POA: Diagnosis present
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
## 2018-09-11 ENCOUNTER — Ambulatory Visit: Payer: Medicaid Other

## 2018-09-11 DIAGNOSIS — M25512 Pain in left shoulder: Secondary | ICD-10-CM | POA: Diagnosis not present

## 2018-09-11 DIAGNOSIS — R29898 Other symptoms and signs involving the musculoskeletal system: Secondary | ICD-10-CM

## 2018-09-11 DIAGNOSIS — M25612 Stiffness of left shoulder, not elsewhere classified: Secondary | ICD-10-CM

## 2018-09-11 DIAGNOSIS — M6281 Muscle weakness (generalized): Secondary | ICD-10-CM

## 2018-09-11 NOTE — Therapy (Addendum)
[facility] [address]  [place], [place], [address] Phone: [phone]   Fax:  [phone]  Physical Therapy Treatment  Patient Details  Name: April Obrien MRN: [id number] Date of Birth: 09/06/1961 Referring Provider (PT): Joni Fears, MD   Encounter Date: 09/11/2018  PT End of Session - 09/11/18 1536    Visit Number  2    Number of Visits  4    Date for PT Re-Evaluation  09/25/18    Authorization Type  Medicaid    Authorization Time Period  12.18.19 - 12.31.19    Authorization - Visit Number  1    Authorization - Number of Visits  3    PT Start Time  1532    PT Stop Time  1625    PT Time Calculation (min)  53 min    Equipment Utilized During Treatment  Other (comment)   L shoulder sling   Activity Tolerance  Patient tolerated treatment well    Behavior During Therapy  [facility] for tasks assessed/performed       Past Medical History:  Diagnosis Date  . Anxiety   . Arthritis    Acromioclavicular  . Barrett syndrome   . Chronic back pain   . Depression   . Diabetes mellitus without complication ([place])   . GERD (gastroesophageal reflux disease)   . Hepatitis    PMH: Hep A  . History of hiatal hernia   . Hypercholesterolemia   . Hypertension   . Hypothyroidism   . Torn rotator cuff    left, with impingement syndrome  . Wears glasses     Past Surgical History:  Procedure Laterality Date  . ANAL FISSURE REPAIR     s/p fissurectomy with Botox sphincterctomy 04/13/17  . BACK SURGERY    . CARPAL TUNNEL RELEASE    . CERVICAL ABLATION    . COLONOSCOPY    . CYSTECTOMY    . elbow     right lateral epicondylitis  . ESOPHAGEAL MANOMETRY N/A 07/31/2018   Procedure: ESOPHAGEAL MANOMETRY (EM);  Surgeon: Wilford Corner, MD;  [facility];  Service: Endoscopy;  Laterality: N/A;  . HERNIA REPAIR    . KNEE SURGERY     arthroscopy on right knee  . [place] IMPEDANCE STUDY N/A 07/31/2018   Procedure: [place] IMPEDANCE STUDY;  Surgeon: Wilford Corner, MD;  [facility];  Service: Endoscopy;  Laterality: N/A;  . SHOULDER ARTHROSCOPY     right   . SHOULDER ARTHROSCOPY WITH OPEN ROTATOR CUFF REPAIR AND DISTAL CLAVICLE ACROMINECTOMY Left 08/13/2018   Procedure: LEFT SHOULDER ARTHROSCOPY WITH SUBACROMIAL DECOMPRESSION, DISTAL CLAVICLE RESECTION, MINI-OPEN ROTATOR CUFF REPAIR AND POSSIBLE BICEPS TENODESIS;  Surgeon: Garald Balding, MD;  Location: [place];  Service: Orthopedics;  Laterality: Left;    There were no vitals filed for this visit.  Subjective Assessment - 09/11/18 1534    Subjective  Pt. doing well today with no new complaints.      Pertinent History  chronic back pain, DM, GERD, HLD, HTN, back surgery, carpal tunnerl release, R knee arthroscopy, R shoulder arthroscopy    Diagnostic tests  06/15/18 L shoulder MRI: Partial tears of the supraspinatus and infraspinatus, AC joint OA, possible biceps tear    Patient Stated Goals  "get back to work"    Currently in Pain?  No/denies    Multiple Pain Sites  No  [facility] PT Treatment/Exercise - 09/11/18 1545      Shoulder Exercises: Supine   Flexion  Left;PROM;10 reps    Flexion Limitations  R UE complete guidance for L UE PROM     Other Supine Exercises  B scapular retraction 5" x 10 reps       Shoulder Exercises: Seated   Retraction  Both;15 reps    Retraction Limitations  5" hold       Shoulder Exercises: Standing   Other Standing Exercises  standing shoulder shrugs 3" x 10 reps       Shoulder Exercises: Isometric Strengthening   Flexion  --   5" x 10 reps; well tolerated    Flexion Limitations  wall tolerated     Extension  --   5" x 10 reps; well tolerated    Extension Limitations  well tolerated     External Rotation  --   5" x 10 reps; well tolerated    External Rotation Limitations  well tolerated     Internal Rotation  --   5" x 10 reps; well tolerated     Internal Rotation Limitations  well tolerated     ABduction  --   5" x 10 reps; neutral elbow   ABduction Limitations  well tolerated      Manual Therapy   Manual Therapy  Passive ROM    Manual therapy comments  supine    Passive ROM  R shoulder PROM to tolerance within restrictions        Vasoneumatic device:  Coldest temp., low pressure, L shoulder, 10'         PT Short Term Goals - 09/11/18 1538      PT SHORT TERM GOAL #1   Title  Patient to be independent with initial HEP.    Time  1    Period  Weeks    Status  On-going        PT Long Term Goals - 09/11/18 1538      PT LONG TERM GOAL #1   Title  Pt will be independent with advanced HEP program     Time  3    Period  Weeks    Status  On-going      PT LONG TERM GOAL #2   Title  Patient to demonstrate L shoulder AROM/PROM pain-free and symmetrical to opposite UE.    Time  3    Period  Weeks    Status  On-going      PT LONG TERM GOAL #3   Title  Patient to demonstrate L shoulder strength >/= 4+/5.     Time  3    Period  Weeks    Status  On-going      PT LONG TERM GOAL #4   Title  Patient to demonstrate L UE overhead reach to retrieve 5# weight without pain.     Time  3    Period  Weeks    Status  On-going            Plan - 09/11/18 1540    Clinical Impression Statement  Doing well today with no new complaints.  Able to tolerated progression per protocol to shoulder shrugs without resistance and L shoulder isometrics at neutral positioning standing well today.  Pt. pain free throughout session today.  Ended visit with ice/compression to L shoulder to reduce post-exercise swelling.  Will continue to progress    Rehab Potential  Good    PT  Frequency  1x / week    PT Duration  3 weeks    PT Treatment/Interventions  ADLs/Self Care Home Management;Cryotherapy;Electrical Stimulation;Moist Heat;Ultrasound;Functional mobility training;Therapeutic activities;Therapeutic exercise;Manual  techniques;Patient/family education;Neuromuscular re-education;Scar mobilization;Passive range of motion;Dry needling;Energy conservation;Splinting;Taping;Vasopneumatic Device    Consulted and Agree with Plan of Care  Patient       Patient will benefit from skilled therapeutic intervention in order to improve the following deficits and impairments:  Increased edema, Decreased scar mobility, Decreased knowledge of precautions, Decreased activity tolerance, Decreased strength, Impaired UE functional use, Pain, Decreased range of motion, Improper body mechanics, Postural dysfunction, Impaired flexibility  Visit Diagnosis: Stiffness of left shoulder, not elsewhere classified  Muscle weakness (generalized)  Other symptoms and signs involving the musculoskeletal system     Problem List Patient Active Problem List   Diagnosis Date Noted  . Unspecified rotator cuff tear or rupture of right shoulder, not specified as traumatic 08/13/2018  . Impingement syndrome of left shoulder 08/13/2018  . Osteoarthritis of left AC (acromioclavicular) joint 08/13/2018  . Lumbar spinal stenosis 08/06/2014    Bess Harvest, PTA 09/11/18 4:27 PM   [facility] [address]  [place], [place], [address] Phone: [phone]   Fax:  [phone]  Name: April Obrien MRN: [id number] Date of Birth: [birth year]

## 2018-09-13 ENCOUNTER — Ambulatory Visit: Payer: Medicaid Other

## 2018-09-13 DIAGNOSIS — R29898 Other symptoms and signs involving the musculoskeletal system: Secondary | ICD-10-CM

## 2018-09-13 DIAGNOSIS — M25512 Pain in left shoulder: Secondary | ICD-10-CM

## 2018-09-13 DIAGNOSIS — M25612 Stiffness of left shoulder, not elsewhere classified: Secondary | ICD-10-CM

## 2018-09-13 DIAGNOSIS — M6281 Muscle weakness (generalized): Secondary | ICD-10-CM

## 2018-09-13 NOTE — Therapy (Signed)
[facility] [address]  [place], [place], [address] Phone: [phone]   Fax:  [phone]  Physical Therapy Treatment  Patient Details  Name: April Obrien MRN: [id number] Date of Birth: 12-Jan-1961 Referring Provider (PT): Joni Fears, MD   Encounter Date: 09/13/2018  PT End of Session - 09/13/18 1105    Visit Number  3    Number of Visits  4    Date for PT Re-Evaluation  09/25/18    Authorization Type  Medicaid    Authorization Time Period  12.18.19 - 12.31.19    Authorization - Visit Number  2    Authorization - Number of Visits  3    PT Start Time  1101    PT Stop Time  1203    PT Time Calculation (min)  62 min    Equipment Utilized During Treatment  Other (comment)   L shoulder sling   Activity Tolerance  Patient tolerated treatment well    Behavior During Therapy  [facility] for tasks assessed/performed       Past Medical History:  Diagnosis Date  . Anxiety   . Arthritis    Acromioclavicular  . Barrett syndrome   . Chronic back pain   . Depression   . Diabetes mellitus without complication ([place])   . GERD (gastroesophageal reflux disease)   . Hepatitis    PMH: Hep A  . History of hiatal hernia   . Hypercholesterolemia   . Hypertension   . Hypothyroidism   . Torn rotator cuff    left, with impingement syndrome  . Wears glasses     Past Surgical History:  Procedure Laterality Date  . ANAL FISSURE REPAIR     s/p fissurectomy with Botox sphincterctomy 04/13/17  . BACK SURGERY    . CARPAL TUNNEL RELEASE    . CERVICAL ABLATION    . COLONOSCOPY    . CYSTECTOMY    . elbow     right lateral epicondylitis  . ESOPHAGEAL MANOMETRY N/A 07/31/2018   Procedure: ESOPHAGEAL MANOMETRY (EM);  Surgeon: Wilford Corner, MD;  Location: [facility];  Service: Endoscopy;  Laterality: N/A;  . HERNIA REPAIR    . KNEE SURGERY     arthroscopy on right knee  . [place] IMPEDANCE STUDY N/A 07/31/2018   Procedure: [place] IMPEDANCE STUDY;  Surgeon: Wilford Corner, MD;  Location: [facility];  Service: Endoscopy;  Laterality: N/A;  . SHOULDER ARTHROSCOPY     right   . SHOULDER ARTHROSCOPY WITH OPEN ROTATOR CUFF REPAIR AND DISTAL CLAVICLE ACROMINECTOMY Left 08/13/2018   Procedure: LEFT SHOULDER ARTHROSCOPY WITH SUBACROMIAL DECOMPRESSION, DISTAL CLAVICLE RESECTION, MINI-OPEN ROTATOR CUFF REPAIR AND POSSIBLE BICEPS TENODESIS;  Surgeon: Garald Balding, MD;  Location: [facility];  Service: Orthopedics;  Laterality: Left;    There were no vitals filed for this visit.  Subjective Assessment - 09/13/18 1103    Subjective  Pt. noting L shoulder soreness over past few days which she attributes to last session.      Pertinent History  chronic back pain, DM, GERD, HLD, HTN, back surgery, carpal tunnerl release, R knee arthroscopy, R shoulder arthroscopy    Diagnostic tests  06/15/18 L shoulder MRI: Partial tears of the supraspinatus and infraspinatus, AC joint OA, possible biceps tear    Patient Stated Goals  "get back to work"    Currently in Pain?  Yes    Pain Score  5     Pain Location  Shoulder  Pain Orientation  Left    Pain Descriptors / Indicators  Aching    Pain Type  Acute pain;Surgical pain    Multiple Pain Sites  No                       [facility] Adult PT Treatment/Exercise - 09/13/18 1114      Shoulder Exercises: Supine   Protraction  Left;10 reps;AAROM    Protraction Limitations  R UE assistance due to soreness      Shoulder Exercises: Seated   Retraction  Both;15 reps    Retraction Limitations  5" hold     Flexion  Left;AAROM;10 reps    Flexion Limitations  table slides       Shoulder Exercises: Standing   Other Standing Exercises  standing shoulder rolls 3" x 10 reps     Other Standing Exercises  standing pendul      Shoulder Exercises: Isometric Strengthening   Flexion  --   5" hold x 10 reps; cues for 10% effort    Flexion Limitations  wall tolerated      Extension  --   5" hold x 10 reps; cues for 10% effort    Extension Limitations  well tolerated     External Rotation  --   5" hold x 10 reps; cues for 10% effort    External Rotation Limitations  well tolerated     Internal Rotation  --   5" hold x 10 reps; cues for 10% effort    Internal Rotation Limitations  well tolerated     ABduction  --   5" hold x 10 reps; cues for 10% effort    ABduction Limitations  well tolerated      Modalities   Modalities  Vasopneumatic      Vasopneumatic   Number Minutes Vasopneumatic   10 minutes    Vasopnuematic Location   Shoulder   L   Vasopneumatic Pressure  Low    Vasopneumatic Temperature   coldest temp.       Manual Therapy   Manual Therapy  Passive ROM    Passive ROM  R shoulder PROM to tolerance within restrictions              PT Education - 09/13/18 1156    Education Details  HEP update; isometrics with elbow in neutral position     Person(s) Educated  Patient    Methods  Explanation;Demonstration;Verbal cues;Handout    Comprehension  Verbalized understanding;Returned demonstration;Verbal cues required;Need further instruction       PT Short Term Goals - 09/13/18 1106      PT SHORT TERM GOAL #1   Title  Patient to be independent with initial HEP.    Time  1    Period  Weeks    Status  Achieved        PT Long Term Goals - 09/11/18 1538      PT LONG TERM GOAL #1   Title  Pt will be independent with advanced HEP program     Time  3    Period  Weeks    Status  On-going      PT LONG TERM GOAL #2   Title  Patient to demonstrate L shoulder AROM/PROM pain-free and symmetrical to opposite UE.    Time  3    Period  Weeks    Status  On-going      PT LONG TERM GOAL #3   Title  Patient to demonstrate L shoulder strength >/= 4+/5.     Time  3    Period  Weeks    Status  On-going      PT LONG TERM GOAL #4   Title  Patient to demonstrate L UE overhead reach to retrieve 5# weight without pain.     Time  3     Period  Weeks    Status  On-going            Plan - 09/13/18 1105    Clinical Impression Statement  pt. reporting some soreness after last visit.  Adjusted isometric activities in today's session to only 10% effort as to reduce muscular soreness.  Pt. tolerated isometric activities in neutral elbow positioning, and supine serratus punch well today per protocol.  Ended visit pain free however ice/compression applied to L shoulder to reduce post-exercise soreness and swelling.  Will plan to monitor response to updated HEP and continue to progress per protocol in coming visits.      Rehab Potential  Good    PT Frequency  1x / week    PT Duration  3 weeks    PT Treatment/Interventions  ADLs/Self Care Home Management;Cryotherapy;Electrical Stimulation;Moist Heat;Ultrasound;Functional mobility training;Therapeutic activities;Therapeutic exercise;Manual techniques;Patient/family education;Neuromuscular re-education;Scar mobilization;Passive range of motion;Dry needling;Energy conservation;Splinting;Taping;Vasopneumatic Device    PT Next Visit Plan  Monitor tolerance to progression per protocol    Consulted and Agree with Plan of Care  Patient       Patient will benefit from skilled therapeutic intervention in order to improve the following deficits and impairments:  Increased edema, Decreased scar mobility, Decreased knowledge of precautions, Decreased activity tolerance, Decreased strength, Impaired UE functional use, Pain, Decreased range of motion, Improper body mechanics, Postural dysfunction, Impaired flexibility  Visit Diagnosis: Stiffness of left shoulder, not elsewhere classified  Muscle weakness (generalized)  Other symptoms and signs involving the musculoskeletal system  Acute pain of left shoulder     Problem List Patient Active Problem List   Diagnosis Date Noted  . Unspecified rotator cuff tear or rupture of right shoulder, not specified as traumatic 08/13/2018  .  Impingement syndrome of left shoulder 08/13/2018  . Osteoarthritis of left AC (acromioclavicular) joint 08/13/2018  . Lumbar spinal stenosis 08/06/2014    Bess Harvest, PTA 09/13/18 12:09 PM   [facility] [address]  [place], [place], [address] Phone: [phone]   Fax:  [phone]  Name: EVANNA WASHINTON MRN: [id number] Date of Birth: 06/12/1961

## 2018-09-17 ENCOUNTER — Other Ambulatory Visit: Payer: Self-pay | Admitting: Gastroenterology

## 2018-09-17 DIAGNOSIS — R112 Nausea with vomiting, unspecified: Secondary | ICD-10-CM

## 2018-09-19 ENCOUNTER — Encounter: Payer: Self-pay | Admitting: Physical Therapy

## 2018-09-19 ENCOUNTER — Ambulatory Visit: Payer: Medicaid Other | Admitting: Physical Therapy

## 2018-09-19 DIAGNOSIS — M25512 Pain in left shoulder: Secondary | ICD-10-CM

## 2018-09-19 DIAGNOSIS — M25612 Stiffness of left shoulder, not elsewhere classified: Secondary | ICD-10-CM

## 2018-09-19 DIAGNOSIS — R29898 Other symptoms and signs involving the musculoskeletal system: Secondary | ICD-10-CM

## 2018-09-19 DIAGNOSIS — M6281 Muscle weakness (generalized): Secondary | ICD-10-CM

## 2018-09-19 NOTE — Therapy (Signed)
[facility] [address]  [place], [place], [address] Phone: [phone]   Fax:  [phone]  Physical Therapy Treatment  Patient Details  Name: April Obrien MRN: [id number] Date of Birth: Aug 02, 1961 Referring Provider (PT): Joni Fears, MD   Encounter Date: 09/19/2018  PT End of Session - 09/19/18 1409    Visit Number  4    Number of Visits  20    Date for PT Re-Evaluation  11/14/18    Authorization Type  Medicaid    Authorization Time Period  12.18.19 - 12.31.19    Authorization - Visit Number  3    Authorization - Number of Visits  3    PT Start Time  1173    PT Stop Time  1406    PT Time Calculation (min)  8 min    Activity Tolerance  Patient tolerated treatment well   limited by stomach pain   Behavior During Therapy  [facility] for tasks assessed/performed       Past Medical History:  Diagnosis Date  . Anxiety   . Arthritis    Acromioclavicular  . Barrett syndrome   . Chronic back pain   . Depression   . Diabetes mellitus without complication ([place])   . GERD (gastroesophageal reflux disease)   . Hepatitis    PMH: Hep A  . History of hiatal hernia   . Hypercholesterolemia   . Hypertension   . Hypothyroidism   . Torn rotator cuff    left, with impingement syndrome  . Wears glasses     Past Surgical History:  Procedure Laterality Date  . ANAL FISSURE REPAIR     s/p fissurectomy with Botox sphincterctomy 04/13/17  . BACK SURGERY    . CARPAL TUNNEL RELEASE    . CERVICAL ABLATION    . COLONOSCOPY    . CYSTECTOMY    . elbow     right lateral epicondylitis  . ESOPHAGEAL MANOMETRY N/A 07/31/2018   Procedure: ESOPHAGEAL MANOMETRY (EM);  Surgeon: Wilford Corner, MD;  [facility]: [facility];  Service: Endoscopy;  Laterality: N/A;  . HERNIA REPAIR    . KNEE SURGERY     arthroscopy on right knee  . [place] IMPEDANCE STUDY N/A 07/31/2018   Procedure: [place] IMPEDANCE STUDY;  Surgeon: Wilford Corner,  MD;  Location: [facility];  Service: Endoscopy;  Laterality: N/A;  . SHOULDER ARTHROSCOPY     right   . SHOULDER ARTHROSCOPY WITH OPEN ROTATOR CUFF REPAIR AND DISTAL CLAVICLE ACROMINECTOMY Left 08/13/2018   Procedure: LEFT SHOULDER ARTHROSCOPY WITH SUBACROMIAL DECOMPRESSION, DISTAL CLAVICLE RESECTION, MINI-OPEN ROTATOR CUFF REPAIR AND POSSIBLE BICEPS TENODESIS;  Surgeon: Garald Balding, MD;  Location: [facility];  Service: Orthopedics;  Laterality: Left;    There were no vitals filed for this visit.  Subjective Assessment - 09/19/18 1358    Subjective  Patient reports that her stomach is not feeling well today and that she has a CT scan on Monday. Reports shoulder has been feeling well. Reports 45-50% improvement since initial eval.     Pertinent History  chronic back pain, DM, GERD, HLD, HTN, back surgery, carpal tunnerl release, R knee arthroscopy, R shoulder arthroscopy    Diagnostic tests  06/15/18 L shoulder MRI: Partial tears of the supraspinatus and infraspinatus, AC joint OA, possible biceps tear    Patient Stated Goals  "get back to work"    Currently in Pain?  No/denies    Pain Score  4  Pain Location  Shoulder    Pain Orientation  Left    Pain Descriptors / Indicators  Aching    Pain Type  Acute pain;Surgical pain         [facility] PT Assessment - 09/19/18 0001      Assessment   Medical Diagnosis  Chronic L shoulder pain; s/p L RTC repair    Referring Provider (PT)  Joni Fears, MD    Onset Date/Surgical Date  08/13/18      PROM   PROM Assessment Site  Shoulder    Right/Left Shoulder  Left    Left Shoulder Flexion  133 Degrees    Left Shoulder ABduction  100 Degrees    Left Shoulder Internal Rotation  86 Degrees    Left Shoulder External Rotation  44 Degrees                           PT Education - 09/19/18 1408    Education Details  edu on following surgical precautions and avoiding behind the back activities    Person(s) Educated   Patient    Methods  Explanation;Demonstration;Tactile cues;Verbal cues    Comprehension  Verbalized understanding;Returned demonstration       PT Short Term Goals - 09/19/18 1415      PT SHORT TERM GOAL #1   Title  Patient to be independent with initial HEP.    Time  1    Period  Weeks    Status  Achieved        PT Long Term Goals - 09/19/18 1415      PT LONG TERM GOAL #1   Title  Pt will be independent with advanced HEP program     Time  8    Period  Weeks    Status  Partially Met   met for current   Target Date  11/14/18      PT LONG TERM GOAL #2   Title  Patient to demonstrate L shoulder AROM/PROM pain-free and symmetrical to opposite UE.    Time  8    Period  Weeks    Status  On-going   improvements demonstrated in L shoulder flexion, abduction, ER   Target Date  11/14/18      PT LONG TERM GOAL #3   Title  Patient to demonstrate L shoulder strength >/= 4+/5.     Time  8    Period  Weeks    Status  On-going   NT d/t surgical precautions   Target Date  11/14/18      PT LONG TERM GOAL #4   Title  Patient to demonstrate L UE overhead reach to retrieve 5# weight without pain.     Time  8    Period  Weeks    Status  On-going   N/T d/t surgical precautions   Target Date  11/14/18            Plan - 09/19/18 1410    Clinical Impression Statement  Patient arrived to session, visibly feeling unwell and reporting that her stomach is bothering her. Reports that she has a CT scan on Monday for this issue. Does report that her L shoulder has been doing better- noting 45-50% improvement since initial eval. Did demonstrate that she is able to bring her L hand behind her back- advised patient to avoid this movement for the time being and abide by surgical precautions. Patient also not seen wearing sling  today. Updated PROM measurements today- with good improvement in L shoulder flexion, abduction, and ER. Other goals not tested at this time per surgical precautions.  Patient requesting to cut appointment short as her stomach was feeling unwell. Patient would benefit from additional skilled PT services 2x/week for 8 weeks to address L shoulder impairments.     PT Frequency  2x / week    PT Duration  8 weeks    PT Treatment/Interventions  ADLs/Self Care Home Management;Cryotherapy;Electrical Stimulation;Moist Heat;Ultrasound;Functional mobility training;Therapeutic activities;Therapeutic exercise;Manual techniques;Patient/family education;Neuromuscular re-education;Scar mobilization;Passive range of motion;Dry needling;Energy conservation;Splinting;Taping;Vasopneumatic Device    PT Next Visit Plan  Monitor tolerance to progression per protocol    Consulted and Agree with Plan of Care  Patient       Patient will benefit from skilled therapeutic intervention in order to improve the following deficits and impairments:  Increased edema, Decreased scar mobility, Decreased knowledge of precautions, Decreased activity tolerance, Decreased strength, Impaired UE functional use, Pain, Decreased range of motion, Improper body mechanics, Postural dysfunction, Impaired flexibility  Visit Diagnosis: Acute pain of left shoulder  Stiffness of left shoulder, not elsewhere classified  Muscle weakness (generalized)  Other symptoms and signs involving the musculoskeletal system     Problem List Patient Active Problem List   Diagnosis Date Noted  . Unspecified rotator cuff tear or rupture of right shoulder, not specified as traumatic 08/13/2018  . Impingement syndrome of left shoulder 08/13/2018  . Osteoarthritis of left AC (acromioclavicular) joint 08/13/2018  . Lumbar spinal stenosis 08/06/2014    Janene Harvey, PT, DPT 09/19/18 2:18 PM   [facility] [address]  [place], [place], [address] Phone: [phone]   Fax:  [phone]  Name: April Obrien MRN: [id number] Date of Birth:  11-Jan-1961

## 2018-09-23 ENCOUNTER — Ambulatory Visit
Admission: RE | Admit: 2018-09-23 | Discharge: 2018-09-23 | Disposition: A | Discharge status: Home / Self Care | Payer: Medicaid Other | Source: Ambulatory Visit | Attending: Gastroenterology | Admitting: Gastroenterology

## 2018-09-23 ENCOUNTER — Encounter: Payer: Self-pay | Admitting: Radiology

## 2018-09-23 DIAGNOSIS — R112 Nausea with vomiting, unspecified: Secondary | ICD-10-CM

## 2018-09-23 MED ORDER — IOPAMIDOL (ISOVUE-300) INJECTION 61%
100.0000 mL | Freq: Once | INTRAVENOUS | Status: AC | PRN
  Administered 2018-09-23: 100 mL via INTRAVENOUS

## 2018-09-26 ENCOUNTER — Ambulatory Visit: Payer: Medicaid Other | Attending: Orthopaedic Surgery | Admitting: Physical Therapy

## 2018-09-26 ENCOUNTER — Encounter: Payer: Self-pay | Admitting: Physical Therapy

## 2018-09-26 DIAGNOSIS — M25612 Stiffness of left shoulder, not elsewhere classified: Secondary | ICD-10-CM | POA: Insufficient documentation

## 2018-09-26 DIAGNOSIS — M6281 Muscle weakness (generalized): Secondary | ICD-10-CM | POA: Insufficient documentation

## 2018-09-26 DIAGNOSIS — M25512 Pain in left shoulder: Secondary | ICD-10-CM | POA: Diagnosis not present

## 2018-09-26 DIAGNOSIS — R29898 Other symptoms and signs involving the musculoskeletal system: Secondary | ICD-10-CM | POA: Insufficient documentation

## 2018-09-26 NOTE — Therapy (Signed)
[facility] [address]  [place], [place], [address] Phone: [phone]   Fax:  [phone]  Physical Therapy Treatment  Patient Details  Name: April Obrien MRN: [id number] Date of Birth: 1961-07-08 Referring Provider (PT): Joni Fears, MD   Encounter Date: 09/26/2018  PT End of Session - 09/26/18 1519    Visit Number  5    Number of Visits  20    Date for PT Re-Evaluation  11/14/18    Authorization Type  Medicaid    Authorization Time Period  09/26/18 - 10/09/18    Authorization - Visit Number  1    Authorization - Number of Visits  3    PT Start Time  1510    PT Stop Time  1553    PT Time Calculation (min)  43 min    Activity Tolerance  Patient limited by pain;Patient tolerated treatment well    Behavior During Therapy  [facility] for tasks assessed/performed       Past Medical History:  Diagnosis Date  . Anxiety   . Arthritis    Acromioclavicular  . Barrett syndrome   . Chronic back pain   . Depression   . Diabetes mellitus without complication ([place])   . GERD (gastroesophageal reflux disease)   . Hepatitis    PMH: Hep A  . History of hiatal hernia   . Hypercholesterolemia   . Hypertension   . Hypothyroidism   . Torn rotator cuff    left, with impingement syndrome  . Wears glasses     Past Surgical History:  Procedure Laterality Date  . ANAL FISSURE REPAIR     s/p fissurectomy with Botox sphincterctomy 04/13/17  . BACK SURGERY    . CARPAL TUNNEL RELEASE    . CERVICAL ABLATION    . COLONOSCOPY    . CYSTECTOMY    . elbow     right lateral epicondylitis  . ESOPHAGEAL MANOMETRY N/A 07/31/2018   Procedure: ESOPHAGEAL MANOMETRY (EM);  Surgeon: Wilford Corner, MD;  Location: [facility];  Service: Endoscopy;  Laterality: N/A;  . HERNIA REPAIR    . KNEE SURGERY     arthroscopy on right knee  . [place] IMPEDANCE STUDY N/A 07/31/2018   Procedure: [place] IMPEDANCE STUDY;  Surgeon: Wilford Corner,  MD;  Location: [facility];  Service: Endoscopy;  Laterality: N/A;  . SHOULDER ARTHROSCOPY     right   . SHOULDER ARTHROSCOPY WITH OPEN ROTATOR CUFF REPAIR AND DISTAL CLAVICLE ACROMINECTOMY Left 08/13/2018   Procedure: LEFT SHOULDER ARTHROSCOPY WITH SUBACROMIAL DECOMPRESSION, DISTAL CLAVICLE RESECTION, MINI-OPEN ROTATOR CUFF REPAIR AND POSSIBLE BICEPS TENODESIS;  Surgeon: Garald Balding, MD;  Location: [facility];  Service: Orthopedics;  Laterality: Left;    There were no vitals filed for this visit.  Subjective Assessment - 09/26/18 1512    Subjective  Reports L shoulder is a little sore today. Has had a few episode of snapping in L shoulder- has been a little painful but has not noticed any changes in functional abilities. Reports she has had L arm and neck pain for a couple weeks now and believes it might be carpal tunnel.  Has taken ibuprofen before the session.     Pertinent History  chronic back pain, DM, GERD, HLD, HTN, back surgery, carpal tunnerl release, R knee arthroscopy, R shoulder arthroscopy    Diagnostic tests  06/15/18 L shoulder MRI: Partial tears of the supraspinatus and infraspinatus, AC joint OA, possible biceps tear  Patient Stated Goals  "get back to work"    Currently in Pain?  Yes    Pain Score  5     Pain Location  Shoulder    Pain Orientation  Left;Anterior;Lateral    Pain Descriptors / Indicators  Aching    Pain Type  Acute pain;Surgical pain                       [facility] Adult PT Treatment/Exercise - 09/26/18 0001      Exercises   Exercises  Shoulder      Shoulder Exercises: Supine   Protraction  Left;10 reps;AAROM    Protraction Limitations  with wand; discontinued d/t L biceps pain    External Rotation  AAROM;Left;10 reps;Limitations    External Rotation Limitations  with wand to tolerance with dowel under elbow   avoiding going past 80 deg    Flexion  AAROM;Left;10 reps;Limitations    Flexion Limitations  with wand; to tolerance     ABduction  AAROM;Left;10 reps    ABduction Limitations  with wand and pillow under L UE; to tolerance   limited by L tricep pain     Shoulder Exercises: Seated   Retraction  Strengthening;Both;10 reps    Retraction Limitations  10x3"     Other Seated Exercises  R & L LS stretch x30"   cues not to push into pain   Other Seated Exercises  R & L UT stretch 2x30"   cues not to push into pain     Shoulder Exercises: Prone   Extension  AROM;Strengthening;Left;10 reps    Extension Limitations  with cues for scap retraction      Shoulder Exercises: Standing   Row  Strengthening;Both;Theraband;12 reps    Theraband Level (Shoulder Row)  Level 1 (Yellow)    Row Limitations  cues for slower movement      Shoulder Exercises: Pulleys   Flexion  3 minutes    Flexion Limitations  with R UE controlling movement; to tolerance    Scaption  3 minutes    Scaption Limitations  with R UE controlling movement; to tolerance      Manual Therapy   Manual Therapy  Passive ROM;Soft tissue mobilization    Soft tissue mobilization  L biceps and lateral triceps- TTP and soft tissue restriction in these areas    Passive ROM  L shoulder PROM to tolerance within restrictions; most guarded and painful with ER               PT Short Term Goals - 09/19/18 1415      PT SHORT TERM GOAL #1   Title  Patient to be independent with initial HEP.    Time  1    Period  Weeks    Status  Achieved        PT Long Term Goals - 09/19/18 1415      PT LONG TERM GOAL #1   Title  Pt will be independent with advanced HEP program     Time  8    Period  Weeks    Status  Partially Met   met for current   Target Date  11/14/18      PT LONG TERM GOAL #2   Title  Patient to demonstrate L shoulder AROM/PROM pain-free and symmetrical to opposite UE.    Time  8    Period  Weeks    Status  On-going   improvements demonstrated in L  shoulder flexion, abduction, ER   Target Date  11/14/18      PT LONG TERM GOAL #3    Title  Patient to demonstrate L shoulder strength >/= 4+/5.     Time  8    Period  Weeks    Status  On-going   NT d/t surgical precautions   Target Date  11/14/18      PT LONG TERM GOAL #4   Title  Patient to demonstrate L UE overhead reach to retrieve 5# weight without pain.     Time  8    Period  Weeks    Status  On-going   N/T d/t surgical precautions   Target Date  11/14/18            Plan - 09/26/18 1559    Clinical Impression Statement  Patient arrived to session with report of mild soreness in L shoulder today without known cause- notes he is having 5/10 pain. Did mention that she has started doing "walk up the wall" exercises despite advising patient multiple times to stick to only the exercises given to her. Patient reported understanding. Introduced [employer] into flexion and abduction to tolerance with patient reporting no pain with this activity. Addressed L arm pain with STM to L biceps and triceps with patient reporting tenderness and palpable restriction noted. Patient limited by L triceps pain during abduction AAROM with wand- advised to perform to tolerance only. Introduced prone L extension and scapular row with cues for scapular retraction. Ended session with patient denying increased pain. Declined ice, noting she would ice at home.     PT Treatment/Interventions  ADLs/Self Care Home Management;Cryotherapy;Electrical Stimulation;Moist Heat;Ultrasound;Functional mobility training;Therapeutic activities;Therapeutic exercise;Manual techniques;Patient/family education;Neuromuscular re-education;Scar mobilization;Passive range of motion;Dry needling;Energy conservation;Splinting;Taping;Vasopneumatic Device    PT Next Visit Plan  Monitor tolerance to progression per protocol    Consulted and Agree with Plan of Care  Patient       Patient will benefit from skilled therapeutic intervention in order to improve the following deficits and impairments:  Increased edema,  Decreased scar mobility, Decreased knowledge of precautions, Decreased activity tolerance, Decreased strength, Impaired UE functional use, Pain, Decreased range of motion, Improper body mechanics, Postural dysfunction, Impaired flexibility  Visit Diagnosis: Acute pain of left shoulder  Stiffness of left shoulder, not elsewhere classified  Muscle weakness (generalized)  Other symptoms and signs involving the musculoskeletal system     Problem List Patient Active Problem List   Diagnosis Date Noted  . Unspecified rotator cuff tear or rupture of right shoulder, not specified as traumatic 08/13/2018  . Impingement syndrome of left shoulder 08/13/2018  . Osteoarthritis of left AC (acromioclavicular) joint 08/13/2018  . Lumbar spinal stenosis 08/06/2014    Janene Harvey, PT, DPT 09/26/18 4:03 PM   [facility] [address]  [place], [place], [address] Phone: [phone]   Fax:  [phone]  Name: RANIYAH CURENTON MRN: [id number] Date of Birth: 05/05/1961

## 2018-09-30 ENCOUNTER — Ambulatory Visit: Payer: Medicaid Other | Admitting: Physical Therapy

## 2018-10-03 ENCOUNTER — Encounter: Payer: Self-pay | Admitting: Physical Therapy

## 2018-10-03 ENCOUNTER — Ambulatory Visit: Payer: Medicaid Other | Admitting: Physical Therapy

## 2018-10-03 DIAGNOSIS — R29898 Other symptoms and signs involving the musculoskeletal system: Secondary | ICD-10-CM

## 2018-10-03 DIAGNOSIS — M25512 Pain in left shoulder: Secondary | ICD-10-CM

## 2018-10-03 DIAGNOSIS — M25612 Stiffness of left shoulder, not elsewhere classified: Secondary | ICD-10-CM

## 2018-10-03 DIAGNOSIS — M6281 Muscle weakness (generalized): Secondary | ICD-10-CM

## 2018-10-03 NOTE — Therapy (Signed)
[facility] [address]  [place], [place], [address] Phone: [phone]   Fax:  [phone]  Physical Therapy Treatment  Patient Details  Name: April Obrien MRN: [id number] Date of Birth: 1960-11-30 Referring Provider (PT): Joni Fears, MD   Encounter Date: 10/03/2018  PT End of Session - 10/03/18 1125    Visit Number  6    Number of Visits  20    Date for PT Re-Evaluation  11/14/18    Authorization Type  Medicaid    Authorization Time Period  09/26/18 - 10/09/18    Authorization - Visit Number  2    Authorization - Number of Visits  3    PT Start Time  1100    PT Stop Time  6962    PT Time Calculation (min)  38 min    Activity Tolerance  Patient limited by pain;Patient tolerated treatment well    Behavior During Therapy  [facility] for tasks assessed/performed       Past Medical History:  Diagnosis Date  . Anxiety   . Arthritis    Acromioclavicular  . Barrett syndrome   . Chronic back pain   . Depression   . Diabetes mellitus without complication ([place])   . GERD (gastroesophageal reflux disease)   . Hepatitis    PMH: Hep A  . History of hiatal hernia   . Hypercholesterolemia   . Hypertension   . Hypothyroidism   . Torn rotator cuff    left, with impingement syndrome  . Wears glasses     Past Surgical History:  Procedure Laterality Date  . ANAL FISSURE REPAIR     s/p fissurectomy with Botox sphincterctomy 04/13/17  . BACK SURGERY    . CARPAL TUNNEL RELEASE    . CERVICAL ABLATION    . COLONOSCOPY    . CYSTECTOMY    . elbow     right lateral epicondylitis  . ESOPHAGEAL MANOMETRY N/A 07/31/2018   Procedure: ESOPHAGEAL MANOMETRY (EM);  Surgeon: Wilford Corner, MD;  [facility];  Service: Endoscopy;  Laterality: N/A;  . HERNIA REPAIR    . KNEE SURGERY     arthroscopy on right knee  . [place] IMPEDANCE STUDY N/A 07/31/2018   Procedure: [place] IMPEDANCE STUDY;  Surgeon: Wilford Corner,  MD;  [facility]: [facility];  Service: Endoscopy;  Laterality: N/A;  . SHOULDER ARTHROSCOPY     right   . SHOULDER ARTHROSCOPY WITH OPEN ROTATOR CUFF REPAIR AND DISTAL CLAVICLE ACROMINECTOMY Left 08/13/2018   Procedure: LEFT SHOULDER ARTHROSCOPY WITH SUBACROMIAL DECOMPRESSION, DISTAL CLAVICLE RESECTION, MINI-OPEN ROTATOR CUFF REPAIR AND POSSIBLE BICEPS TENODESIS;  Surgeon: Garald Balding, MD;  Location: [facility];  Service: Orthopedics;  Laterality: Left;    There were no vitals filed for this visit.  Subjective Assessment - 10/03/18 1101    Subjective  Reports she has been having trouble with her neck and it has been getting worse. Also reports L arm, rather than shoulder, has been bothering her.     Pertinent History  chronic back pain, DM, GERD, HLD, HTN, back surgery, carpal tunnerl release, R knee arthroscopy, R shoulder arthroscopy    Diagnostic tests  06/15/18 L shoulder MRI: Partial tears of the supraspinatus and infraspinatus, AC joint OA, possible biceps tear    Patient Stated Goals  "get back to work"    Currently in Pain?  Yes    Pain Score  7     Pain Location  Arm  Pain Orientation  Left    Pain Descriptors / Indicators  --   grabbing   Pain Type  Acute pain    Multiple Pain Sites  Yes    Pain Score  8    Pain Location  Neck    Pain Orientation  Left    Pain Descriptors / Indicators  Burning    Pain Type  Chronic pain                       [facility] Adult PT Treatment/Exercise - 10/03/18 0001      Shoulder Exercises: Supine   Protraction  Left;10 reps;AAROM    Protraction Limitations  with wand;     Flexion  AAROM;Left;10 reps;Limitations    Flexion Limitations  with wand; to tolerance    ABduction  AAROM;Left;10 reps    ABduction Limitations  with wand and pillow under L UE; to tolerance   reporting pain after 8 reps- discontinued     Shoulder Exercises: Prone   Extension  AROM;Strengthening;Left;10 reps    Extension Limitations  with cues for  scap retraction      Shoulder Exercises: Pulleys   Flexion  3 minutes    Flexion Limitations  with R UE controlling movement; to tolerance    Scaption  3 minutes    Scaption Limitations  with R UE controlling movement; to tolerance      Vasopneumatic   Number Minutes Vasopneumatic   15 minutes    Vasopnuematic Location   Shoulder   L   Vasopneumatic Pressure  Low    Vasopneumatic Temperature   coldest temp.       Manual Therapy   Manual Therapy  Soft tissue mobilization    Soft tissue mobilization  L UT and cervical parapsinals, L biceps and lateral triceps- TTP and soft tissue restriction in these areas   unable to tolerate d/t pain- discotninued            PT Education - 10/03/18 1130    Education Details  edu on common referral patterns from cervical spine and RTC, advised patient to contact her MD's about persisting pain    Person(s) Educated  Patient    Methods  Explanation    Comprehension  Verbalized understanding       PT Short Term Goals - 09/19/18 1415      PT SHORT TERM GOAL #1   Title  Patient to be independent with initial HEP.    Time  1    Period  Weeks    Status  Achieved        PT Long Term Goals - 09/19/18 1415      PT LONG TERM GOAL #1   Title  Pt will be independent with advanced HEP program     Time  8    Period  Weeks    Status  Partially Met   met for current   Target Date  11/14/18      PT LONG TERM GOAL #2   Title  Patient to demonstrate L shoulder AROM/PROM pain-free and symmetrical to opposite UE.    Time  8    Period  Weeks    Status  On-going   improvements demonstrated in L shoulder flexion, abduction, ER   Target Date  11/14/18      PT LONG TERM GOAL #3   Title  Patient to demonstrate L shoulder strength >/= 4+/5.     Time  8  Period  Weeks    Status  On-going   NT d/t surgical precautions   Target Date  11/14/18      PT LONG TERM GOAL #4   Title  Patient to demonstrate L UE overhead reach to retrieve 5# weight  without pain.     Time  8    Period  Weeks    Status  On-going   N/T d/t surgical precautions   Target Date  11/14/18            Plan - 10/03/18 1126    Clinical Impression Statement  Patient arrived to session with report of significant pain in L side of neck and L arm. Notes that she has had cervical pain in the past, treated by Dr. Leontine Locket. Advised patient to contact her MD about this issue is pain persists. Patient reporting that L shoulder ER with wand bothered her L shoulder during last session, however patient with no mention of this during the treatment session. Patient also admits to performing exercises that she was not instructed to perform with L shoulder and not being compliant with sling use in the past. Attempted STM to L cervical paraspinals, UT, biceps, and triceps, however patient unable to tolerate. Attempted gentle AAROM, however patient again with significant pain in L arm and cervical spine. Ended session with [place] to L shoulder and advised patient to contact her surgeon for follow up d/t this pain. Patient reported understanding and mild relief of pain from modality.     PT Treatment/Interventions  ADLs/Self Care Home Management;Cryotherapy;Electrical Stimulation;Moist Heat;Ultrasound;Functional mobility training;Therapeutic activities;Therapeutic exercise;Manual techniques;Patient/family education;Neuromuscular re-education;Scar mobilization;Passive range of motion;Dry needling;Energy conservation;Splinting;Taping;Vasopneumatic Device    PT Next Visit Plan  Monitor tolerance to progression per protocol    Consulted and Agree with Plan of Care  Patient       Patient will benefit from skilled therapeutic intervention in order to improve the following deficits and impairments:  Increased edema, Decreased scar mobility, Decreased knowledge of precautions, Decreased activity tolerance, Decreased strength, Impaired UE functional use, Pain, Decreased range of motion,  Improper body mechanics, Postural dysfunction, Impaired flexibility  Visit Diagnosis: Acute pain of left shoulder  Stiffness of left shoulder, not elsewhere classified  Muscle weakness (generalized)  Other symptoms and signs involving the musculoskeletal system     Problem List Patient Active Problem List   Diagnosis Date Noted  . Unspecified rotator cuff tear or rupture of right shoulder, not specified as traumatic 08/13/2018  . Impingement syndrome of left shoulder 08/13/2018  . Osteoarthritis of left AC (acromioclavicular) joint 08/13/2018  . Lumbar spinal stenosis 08/06/2014     Janene Harvey, PT, DPT 10/03/18 11:39 AM   [facility] [address]  [place], [place], [address] Phone: [phone]   Fax:  [phone]  Name: EDDYE BROXTERMAN MRN: [id number] Date of Birth: Aug 02, 1961

## 2018-10-08 ENCOUNTER — Ambulatory Visit: Payer: Medicaid Other | Admitting: Physical Therapy

## 2018-10-08 ENCOUNTER — Encounter: Payer: Self-pay | Admitting: Physical Therapy

## 2018-10-08 DIAGNOSIS — M25612 Stiffness of left shoulder, not elsewhere classified: Secondary | ICD-10-CM

## 2018-10-08 DIAGNOSIS — R29898 Other symptoms and signs involving the musculoskeletal system: Secondary | ICD-10-CM

## 2018-10-08 DIAGNOSIS — M25512 Pain in left shoulder: Secondary | ICD-10-CM

## 2018-10-08 DIAGNOSIS — M6281 Muscle weakness (generalized): Secondary | ICD-10-CM

## 2018-10-08 NOTE — Therapy (Addendum)
[facility] [address]  [place], [place], [address] Phone: [phone]   Fax:  [phone]  Physical Therapy Treatment  Patient Details  Name: April Obrien MRN: [id number] Date of Birth: April 20, 1961 Referring Provider (PT): Joni Fears, MD   Encounter Date: 10/08/2018  PT End of Session - 10/08/18 1125    Visit Number  7    Number of Visits  20    Date for PT Re-Evaluation  11/14/18    Authorization Type  Medicaid    Authorization Time Period  09/26/18 - 10/09/18    Authorization - Visit Number  3    Authorization - Number of Visits  3    PT Start Time  1100    PT Stop Time  1133   moist heat   PT Time Calculation (min)  33 min    Activity Tolerance  Patient limited by pain;Patient tolerated treatment well    Behavior During Therapy  [facility] for tasks assessed/performed       Past Medical History:  Diagnosis Date  . Anxiety   . Arthritis    Acromioclavicular  . Barrett syndrome   . Chronic back pain   . Depression   . Diabetes mellitus without complication ([place])   . GERD (gastroesophageal reflux disease)   . Hepatitis    PMH: Hep A  . History of hiatal hernia   . Hypercholesterolemia   . Hypertension   . Hypothyroidism   . Torn rotator cuff    left, with impingement syndrome  . Wears glasses     Past Surgical History:  Procedure Laterality Date  . ANAL FISSURE REPAIR     s/p fissurectomy with Botox sphincterctomy 04/13/17  . BACK SURGERY    . CARPAL TUNNEL RELEASE    . CERVICAL ABLATION    . COLONOSCOPY    . CYSTECTOMY    . elbow     right lateral epicondylitis  . ESOPHAGEAL MANOMETRY N/A 07/31/2018   Procedure: ESOPHAGEAL MANOMETRY (EM);  Surgeon: Wilford Corner, MD;  Location: [facility];  Service: Endoscopy;  Laterality: N/A;  . HERNIA REPAIR    . KNEE SURGERY     arthroscopy on right knee  . [place] IMPEDANCE STUDY N/A 07/31/2018   Procedure: [place] IMPEDANCE STUDY;  Surgeon:  Wilford Corner, MD;  [facility];  Service: Endoscopy;  Laterality: N/A;  . SHOULDER ARTHROSCOPY     right   . SHOULDER ARTHROSCOPY WITH OPEN ROTATOR CUFF REPAIR AND DISTAL CLAVICLE ACROMINECTOMY Left 08/13/2018   Procedure: LEFT SHOULDER ARTHROSCOPY WITH SUBACROMIAL DECOMPRESSION, DISTAL CLAVICLE RESECTION, MINI-OPEN ROTATOR CUFF REPAIR AND POSSIBLE BICEPS TENODESIS;  Surgeon: Garald Balding, MD;  Location: [place];  Service: Orthopedics;  Laterality: Left;    There were no vitals filed for this visit.  Subjective Assessment - 10/08/18 1101    Subjective  Reports that today is not a good day- is having a lot of pain and believes that it is coming from her neck. "Hurting down my spine and up into the back of my neck" to the point of giving her HA. Requesting not to do any exercises for neck today. Reports 75% improvement since initial eval in L shoulder. Notes improvements in ROM.     Pertinent History  chronic back pain, DM, GERD, HLD, HTN, back surgery, carpal tunnerl release, R knee arthroscopy, R shoulder arthroscopy    Diagnostic tests  06/15/18 L shoulder MRI: Partial tears of the supraspinatus and infraspinatus,  AC joint OA, possible biceps tear    Patient Stated Goals  "get back to work"    Currently in Pain?  Yes    Pain Score  8     Pain Location  Neck    Pain Orientation  Left    Pain Descriptors / Indicators  Burning;Aching    Pain Radiating Towards  radiating to UT         [facility] PT Assessment - 10/08/18 0001      AROM   AROM Assessment Site  Shoulder    Right/Left Shoulder  Left    Left Shoulder Flexion  129 Degrees    Left Shoulder ABduction  129 Degrees    Left Shoulder Internal Rotation  55 Degrees    Left Shoulder External Rotation  90 Degrees      PROM   Right/Left Shoulder  Left    Left Shoulder Flexion  148 Degrees    Left Shoulder ABduction  163 Degrees    Left Shoulder Internal Rotation  74 Degrees    Left Shoulder External Rotation  82  Degrees      Strength   Right/Left Shoulder  Left    Left Shoulder Flexion  3+/5    Left Shoulder ABduction  3+/5    Left Shoulder Internal Rotation  3+/5    Left Shoulder External Rotation  3+/5                   [facility] Adult PT Treatment/Exercise - 10/08/18 0001      Shoulder Exercises: Pulleys   Flexion  3 minutes    Flexion Limitations  with R UE controlling movement; to tolerance    Scaption  3 minutes    Scaption Limitations  with R UE controlling movement; to tolerance      Modalities   Modalities  Moist Heat      Moist Heat Therapy   Number Minutes Moist Heat  10 Minutes    Moist Heat Location  Cervical      Manual Therapy   Manual Therapy  Soft tissue mobilization    Soft tissue mobilization  L suboccipitals, cervical paraspinals, UT- severely TTP throughout               PT Short Term Goals - 10/08/18 1105      PT SHORT TERM GOAL #1   Title  Patient to be independent with initial HEP.    Time  1    Period  Weeks    Status  Achieved        PT Long Term Goals - 10/08/18 1106      PT LONG TERM GOAL #1   Title  Pt will be independent with advanced HEP program     Time  8    Period  Weeks    Status  Partially Met   met for current     PT LONG TERM GOAL #2   Title  Patient to demonstrate L shoulder AROM/PROM pain-free and symmetrical to opposite UE.    Time  8    Period  Weeks    Status  On-going   able to tolerate L shoulder AROM this date, improvements in PROM demonstrated in flexion, abduction, and ER     PT LONG TERM GOAL #3   Title  Patient to demonstrate L shoulder strength >/= 4+/5.     Time  8    Period  Weeks    Status  On-going   3+/5  in all planes     PT LONG TERM GOAL #4   Title  Patient to demonstrate L UE overhead reach to retrieve 5# weight without pain.     Time  8    Period  Weeks    Status  On-going   N/T d/t surgical precautions           Plan - 10/08/18 1132    Clinical Impression Statement   Patient arrived to session with report of severe L sided neck pain. Notes that she has not set up an appointment with either MD to address L arm or neck pain, but plans to do so. Patient reports 75% improvement in L shoulder since initial eval, noting improvements in ROM. Patient able to tolerate L shoulder AROM this date, demonstrating fairly good ROM. Also able to tolerate light resistance during strength testing, scoring 3+/5 in all planes. Patient is showing improvements in flexion, abduction, and ER L shoulder PROM. D/t patient's c/o neck pain, provided STM to L suboccipital, cervical paraspinals, and UT, however patient severely tender throughout and with no improvement in symptoms after manual therapy. Patient reporting that she cannot tolerate full treatment session today d/t neck pain. Ended session with moist heat to cervical spine, with patient reporting relief. Advised patient to follow up with MD's to address her pain in order to be able to progress with shoulder rehab. Patient reported understanding.       PT Treatment/Interventions  ADLs/Self Care Home Management;Cryotherapy;Electrical Stimulation;Moist Heat;Ultrasound;Functional mobility training;Therapeutic activities;Therapeutic exercise;Manual techniques;Patient/family education;Neuromuscular re-education;Scar mobilization;Passive range of motion;Dry needling;Energy conservation;Splinting;Taping;Vasopneumatic Device    PT Next Visit Plan  Monitor tolerance to progression per protocol    Consulted and Agree with Plan of Care  Patient       Patient will benefit from skilled therapeutic intervention in order to improve the following deficits and impairments:  Increased edema, Decreased scar mobility, Decreased knowledge of precautions, Decreased activity tolerance, Decreased strength, Impaired UE functional use, Pain, Decreased range of motion, Improper body mechanics, Postural dysfunction, Impaired flexibility  Visit Diagnosis: Acute pain  of left shoulder  Stiffness of left shoulder, not elsewhere classified  Muscle weakness (generalized)  Other symptoms and signs involving the musculoskeletal system     Problem List Patient Active Problem List   Diagnosis Date Noted  . Unspecified rotator cuff tear or rupture of right shoulder, not specified as traumatic 08/13/2018  . Impingement syndrome of left shoulder 08/13/2018  . Osteoarthritis of left AC (acromioclavicular) joint 08/13/2018  . Lumbar spinal stenosis 08/06/2014     Janene Harvey, PT, DPT 10/08/18 11:37 AM   [facility] [address]  [place], [place], [address] Phone: [phone]   Fax:  [phone]  Name: April Obrien MRN: [id number] Date of Birth: [birth year]

## 2018-10-15 ENCOUNTER — Encounter (INDEPENDENT_AMBULATORY_CARE_PROVIDER_SITE_OTHER): Payer: Self-pay | Admitting: Orthopaedic Surgery

## 2018-10-15 ENCOUNTER — Ambulatory Visit (INDEPENDENT_AMBULATORY_CARE_PROVIDER_SITE_OTHER): Payer: Medicaid Other | Admitting: Orthopaedic Surgery

## 2018-10-15 DIAGNOSIS — M25512 Pain in left shoulder: Secondary | ICD-10-CM

## 2018-10-15 DIAGNOSIS — G8929 Other chronic pain: Secondary | ICD-10-CM

## 2018-10-15 NOTE — Progress Notes (Signed)
Office Visit Note   Patient: April Obrien           Date of Birth: 23-Feb-1961           MRN: [id number] Visit Date: 10/15/2018              Requested by: Alma Friendly, MD No address on file PCP: Alma Friendly, MD   Assessment & Plan: Visit Diagnoses:  1. Chronic left shoulder pain     Plan: 2 months status post rotator cuff tear repair with a mini incision left shoulder and doing very well with the therapy.  Having some issues with the cervical spine being followed by a neurosurgeon in [place].  We will continue with her therapy at home.  We will plan to see her back as needed.  She is done very well from my standpoint is not having any of her preoperative pain.  She has regained full motion  Follow-Up Instructions: Return if symptoms worsen or fail to improve.   Orders:  No orders of the defined types were placed in this encounter.  No orders of the defined types were placed in this encounter.     Procedures: No procedures performed   Clinical Data: No additional findings.   Subjective: Chief Complaint  Patient presents with  . Left Shoulder - Pain  . Follow-up    Lt shoulder have a knot and the neck is stiff, painful. Pt seen Dr. Anabel Halon  2 months status post arthroscopic SCD, DCR and mini open rotator cuff tear repair left shoulder.  There were tears of both the infra and supraspinatus with delamination.  Has done very well with her physical therapy without any significant pain and full overhead motion.  Does have chronic issues with the cervical spine and being followed by a therapist and neurosurgeon in [place]  HPI  Review of Systems  Constitutional: Negative.   HENT: Negative.   Eyes: Negative.   Respiratory: Negative.   Cardiovascular: Negative.   Gastrointestinal: Negative.   Endocrine: Negative.   Genitourinary: Negative.   Musculoskeletal: Positive for neck pain and neck stiffness.  Skin: Negative.   Neurological: Negative.     Hematological: Negative.   Psychiatric/Behavioral: Negative.      Objective: Vital Signs: There were no vitals taken for this visit.  Physical Exam  Ortho Exam awake alert and oriented x3.  Comfortable sitting.  Full overhead quick motion of the left shoulder with negative impingement.  Little bit of weakness as expected.  Empty can testing is negative.  Able to reproduce the pain in her left arm with neck extension which also makes her a little dizzy.  Suspect that pain is obviously referred from the cervical spine.  Good grip and good release  Specialty Comments:  No specialty comments available.  Imaging: No results found.   PMFS History: Patient Active Problem List   Diagnosis Date Noted  . Unspecified rotator cuff tear or rupture of right shoulder, not specified as traumatic 08/13/2018  . Impingement syndrome of left shoulder 08/13/2018  . Osteoarthritis of left AC (acromioclavicular) joint 08/13/2018  . Lumbar spinal stenosis 08/06/2014   Past Medical History:  Diagnosis Date  . Anxiety   . Arthritis    Acromioclavicular  . Barrett syndrome   . Chronic back pain   . Depression   . Diabetes mellitus without complication ([place])   . GERD (gastroesophageal reflux disease)   . Hepatitis    PMH: Hep A  . History of hiatal hernia   .  Hypercholesterolemia   . Hypertension   . Hypothyroidism   . Torn rotator cuff    left, with impingement syndrome  . Wears glasses     Family History  Problem Relation Age of Onset  . Diabetes Father   . Heart disease Father     Past Surgical History:  Procedure Laterality Date  . ANAL FISSURE REPAIR     s/p fissurectomy with Botox sphincterctomy 04/13/17  . BACK SURGERY    . CARPAL TUNNEL RELEASE    . CERVICAL ABLATION    . COLONOSCOPY    . CYSTECTOMY    . elbow     right lateral epicondylitis  . ESOPHAGEAL MANOMETRY N/A 07/31/2018   Procedure: ESOPHAGEAL MANOMETRY (EM);  Surgeon: Wilford Corner, MD;  [facility];  Service: Endoscopy;  Laterality: N/A;  . HERNIA REPAIR    . KNEE SURGERY     arthroscopy on right knee  . [place] IMPEDANCE STUDY N/A 07/31/2018   Procedure: [place] IMPEDANCE STUDY;  Surgeon: Wilford Corner, MD;  [facility];  Service: Endoscopy;  Laterality: N/A;  . SHOULDER ARTHROSCOPY     right   . SHOULDER ARTHROSCOPY WITH OPEN ROTATOR CUFF REPAIR AND DISTAL CLAVICLE ACROMINECTOMY Left 08/13/2018   Procedure: LEFT SHOULDER ARTHROSCOPY WITH SUBACROMIAL DECOMPRESSION, DISTAL CLAVICLE RESECTION, MINI-OPEN ROTATOR CUFF REPAIR AND POSSIBLE BICEPS TENODESIS;  Surgeon: Garald Balding, MD;  Location: [facility];  Service: Orthopedics;  Laterality: Left;   Social History   Occupational History  . Not on file  Tobacco Use  . Smoking status: Former Smoker    Packs/day: 1.00    Years: 26.00    Pack years: 26.00    Types: Cigarettes    Start date: 01/20/1975    Last attempt to quit: 06/25/2014    Years since quitting: 4.3  . Smokeless tobacco: Never Used  Substance and Sexual Activity  . Alcohol use: Yes    Alcohol/week: 0.0 standard drinks    Comment: rare  . Drug use: Yes    Types: Marijuana    Comment: last use October 2019  . Sexual activity: Not on file

## 2018-10-17 ENCOUNTER — Encounter: Payer: Self-pay | Admitting: Physical Therapy

## 2018-10-17 ENCOUNTER — Ambulatory Visit: Payer: Medicaid Other | Admitting: Physical Therapy

## 2018-10-17 DIAGNOSIS — M25512 Pain in left shoulder: Secondary | ICD-10-CM

## 2018-10-17 DIAGNOSIS — M25612 Stiffness of left shoulder, not elsewhere classified: Secondary | ICD-10-CM

## 2018-10-17 DIAGNOSIS — M6281 Muscle weakness (generalized): Secondary | ICD-10-CM

## 2018-10-17 DIAGNOSIS — R29898 Other symptoms and signs involving the musculoskeletal system: Secondary | ICD-10-CM

## 2018-10-17 NOTE — Therapy (Signed)
[facility] [address]  [place], [place], [address] Phone: [phone]   Fax:  [phone]  Physical Therapy Treatment  Patient Details  Name: April Obrien MRN: [id number] Date of Birth: 1960/10/17 Referring Provider (PT): Joni Fears, MD   Encounter Date: 10/17/2018  PT End of Session - 10/17/18 1649    Visit Number  8    Number of Visits  20    Date for PT Re-Evaluation  11/14/18    Authorization Type  Medicaid    Authorization Time Period  10/17/18 - 11/27/18    Authorization - Visit Number  1    Authorization - Number of Visits  6    PT Start Time  1609    PT Stop Time  1657   moist heat   PT Time Calculation (min)  48 min    Activity Tolerance  Patient limited by pain;Patient tolerated treatment well    Behavior During Therapy  [facility] for tasks assessed/performed       Past Medical History:  Diagnosis Date  . Anxiety   . Arthritis    Acromioclavicular  . Barrett syndrome   . Chronic back pain   . Depression   . Diabetes mellitus without complication ([place])   . GERD (gastroesophageal reflux disease)   . Hepatitis    PMH: Hep A  . History of hiatal hernia   . Hypercholesterolemia   . Hypertension   . Hypothyroidism   . Torn rotator cuff    left, with impingement syndrome  . Wears glasses     Past Surgical History:  Procedure Laterality Date  . ANAL FISSURE REPAIR     s/p fissurectomy with Botox sphincterctomy 04/13/17  . BACK SURGERY    . CARPAL TUNNEL RELEASE    . CERVICAL ABLATION    . COLONOSCOPY    . CYSTECTOMY    . elbow     right lateral epicondylitis  . ESOPHAGEAL MANOMETRY N/A 07/31/2018   Procedure: ESOPHAGEAL MANOMETRY (EM);  Surgeon: Wilford Corner, MD;  [facility];  Service: Endoscopy;  Laterality: N/A;  . HERNIA REPAIR    . KNEE SURGERY     arthroscopy on right knee  . [place] IMPEDANCE STUDY N/A 07/31/2018   Procedure: [place] IMPEDANCE STUDY;  Surgeon:  Wilford Corner, MD;  [facility];  Service: Endoscopy;  Laterality: N/A;  . SHOULDER ARTHROSCOPY     right   . SHOULDER ARTHROSCOPY WITH OPEN ROTATOR CUFF REPAIR AND DISTAL CLAVICLE ACROMINECTOMY Left 08/13/2018   Procedure: LEFT SHOULDER ARTHROSCOPY WITH SUBACROMIAL DECOMPRESSION, DISTAL CLAVICLE RESECTION, MINI-OPEN ROTATOR CUFF REPAIR AND POSSIBLE BICEPS TENODESIS;  Surgeon: Garald Balding, MD;  Location: [place];  Service: Orthopedics;  Laterality: Left;    There were no vitals filed for this visit.  Subjective Assessment - 10/17/18 1609    Subjective  Reports that she saw her shoulder surgeon advised her that her pain is coming from her neck and he is happy with her progress thus far. Also saw her neck MD who plans to give her an injection.     Pertinent History  chronic back pain, DM, GERD, HLD, HTN, back surgery, carpal tunnerl release, R knee arthroscopy, R shoulder arthroscopy    Diagnostic tests  06/15/18 L shoulder MRI: Partial tears of the supraspinatus and infraspinatus, AC joint OA, possible biceps tear    Patient Stated Goals  "get back to work"    Currently in Pain?  Yes  Pain Score  4     Pain Location  Arm    Pain Orientation  Left    Pain Descriptors / Indicators  Burning;Aching    Pain Type  Acute pain                       [facility] Adult PT Treatment/Exercise - 10/17/18 0001      Shoulder Exercises: Seated   Horizontal ABduction  Strengthening;Both;10 reps;Theraband    Theraband Level (Shoulder Horizontal ABduction)  Level 1 (Yellow)    Horizontal ABduction Weight (lbs)  cues for slight scap retraction    External Rotation  Strengthening;Both;10 reps;Theraband    Theraband Level (Shoulder External Rotation)  Level 1 (Yellow)    External Rotation Limitations  cues to maintain elbows in at sides    Flexion  Strengthening;Left;5 reps;Weights    Flexion Weight (lbs)  1    Flexion Limitations  report of some neck pain, but tolerable     Abduction  Strengthening;Left;5 reps;Weights    ABduction Weight (lbs)  1    ABduction Limitations  cues to decrease speed    Other Seated Exercises  L shoulder AAROM into flexion, abduction, ER/IR x5 to tolerance each direction      Shoulder Exercises: Standing   External Rotation  Strengthening;Left;10 reps;Theraband    Theraband Level (Shoulder External Rotation)  Level 1 (Yellow)    External Rotation Limitations  dowel under elbow; cues to maintain neutral    Internal Rotation  Strengthening;Left;10 reps;Theraband    Theraband Level (Shoulder Internal Rotation)  Level 1 (Yellow)    Internal Rotation Limitations  dowel under elbow; cues to maintain neutral    Extension  Strengthening;Both;10 reps;Theraband    Theraband Level (Shoulder Extension)  Level 2 (Red)    Extension Limitations  cues for scap retraction    Row  Strengthening;Both;Theraband;10 reps    Theraband Level (Shoulder Row)  Level 2 (Red)    Row Limitations  cues for scap retraction    Other Standing Exercises  B tricep pulldowns with red TB x10   cues to maintain elbows bent at starting position     Shoulder Exercises: Pulleys   Flexion  3 minutes    Flexion Limitations  to tolerance    Scaption  3 minutes    Scaption Limitations  to tolerance; cues to maintain shoulders in scaption      Shoulder Exercises: Stretch   Corner Stretch  3 reps;20 seconds    Corner Stretch Limitations  90/90 pec stretch at doorway to tolerance      Moist Heat Therapy   Number Minutes Moist Heat  10 Minutes    Moist Heat Location  Cervical             PT Education - 10/17/18 1649    Education Details  update to HEP, administered red and yellow TB    Person(s) Educated  Patient    Methods  Explanation;Demonstration;Tactile cues;Verbal cues;Handout    Comprehension  Verbalized understanding;Returned demonstration       PT Short Term Goals - 10/08/18 1105      PT SHORT TERM GOAL #1   Title  Patient to be independent with  initial HEP.    Time  1    Period  Weeks    Status  Achieved        PT Long Term Goals - 10/08/18 1106      PT LONG TERM GOAL #1   Title  Pt  will be independent with advanced HEP program     Time  8    Period  Weeks    Status  Partially Met   met for current     PT LONG TERM GOAL #2   Title  Patient to demonstrate L shoulder AROM/PROM pain-free and symmetrical to opposite UE.    Time  8    Period  Weeks    Status  On-going   able to tolerate L shoulder AROM this date, improvements in PROM demonstrated in flexion, abduction, and ER     PT LONG TERM GOAL #3   Title  Patient to demonstrate L shoulder strength >/= 4+/5.     Time  8    Period  Weeks    Status  On-going   3+/5 in all planes     PT LONG TERM GOAL #4   Title  Patient to demonstrate L UE overhead reach to retrieve 5# weight without pain.     Time  8    Period  Weeks    Status  On-going   N/T d/t surgical precautions           Plan - 10/17/18 1650    Clinical Impression Statement  Patient arrived to session with report that her surgeon advised her that her R arm pain is likely stemming from her neck. Scheduled for cervical injection on 11/08/18. Worked on sitting and standing resistive RTC and periscapular strengthening today to avoid cervical pain in supine/sidelying. Patient with good tolerance of progression of exercises, with intermittent c/o neck and R arm pain. Required intermittent cues to encourage scapular retraction rather than shoulder elevation or protraction. Patient also with tendency to perform exercises quickly, advised to slow down to improve motor control. Updated HEP with banded exercises that were well tolerated today. Ended session with moist heat to cervical spine for pain. Patient without further complaints at end of session.     PT Treatment/Interventions  ADLs/Self Care Home Management;Cryotherapy;Electrical Stimulation;Moist Heat;Ultrasound;Functional mobility training;Therapeutic  activities;Therapeutic exercise;Manual techniques;Patient/family education;Neuromuscular re-education;Scar mobilization;Passive range of motion;Dry needling;Energy conservation;Splinting;Taping;Vasopneumatic Device    PT Next Visit Plan  Monitor tolerance to progression per protocol    Consulted and Agree with Plan of Care  Patient       Patient will benefit from skilled therapeutic intervention in order to improve the following deficits and impairments:  Increased edema, Decreased scar mobility, Decreased knowledge of precautions, Decreased activity tolerance, Decreased strength, Impaired UE functional use, Pain, Decreased range of motion, Improper body mechanics, Postural dysfunction, Impaired flexibility  Visit Diagnosis: Acute pain of left shoulder  Stiffness of left shoulder, not elsewhere classified  Muscle weakness (generalized)  Other symptoms and signs involving the musculoskeletal system     Problem List Patient Active Problem List   Diagnosis Date Noted  . Unspecified rotator cuff tear or rupture of right shoulder, not specified as traumatic 08/13/2018  . Impingement syndrome of left shoulder 08/13/2018  . Osteoarthritis of left AC (acromioclavicular) joint 08/13/2018  . Lumbar spinal stenosis 08/06/2014    Janene Harvey, PT, DPT 10/17/18 4:58 PM    [facility] [address]  [place], [place], [address] Phone: [phone]   Fax:  [phone]  Name: TRANY CHERNICK MRN: [id number] Date of Birth: 25-Jun-1961

## 2018-10-22 ENCOUNTER — Encounter: Payer: Medicaid Other | Admitting: Physical Therapy

## 2018-10-29 ENCOUNTER — Encounter: Payer: Self-pay | Admitting: Physical Therapy

## 2018-10-29 ENCOUNTER — Ambulatory Visit: Payer: Medicaid Other | Admitting: Physical Therapy

## 2018-10-29 DIAGNOSIS — M25512 Pain in left shoulder: Secondary | ICD-10-CM | POA: Insufficient documentation

## 2018-10-29 DIAGNOSIS — M6281 Muscle weakness (generalized): Secondary | ICD-10-CM | POA: Insufficient documentation

## 2018-10-29 DIAGNOSIS — M25612 Stiffness of left shoulder, not elsewhere classified: Secondary | ICD-10-CM | POA: Insufficient documentation

## 2018-10-29 DIAGNOSIS — R29898 Other symptoms and signs involving the musculoskeletal system: Secondary | ICD-10-CM | POA: Insufficient documentation

## 2018-10-29 NOTE — Therapy (Signed)
[facility] [address]  [place], [place], [address] Phone: [phone]   Fax:  [phone]  Patient Details  Name: April Obrien MRN: [id number] Date of Birth: August 15, 1961 Referring Provider:  Alma Friendly, MD  Encounter Date: 10/29/2018   Patient arrived to session with report that she is "sick to my stomach" and has been vomitting. Also reports that her neck has been bothering her and is scheduled for a shot. Patient reporting that she does not feel well enough to participate today, but has been performing HEP. Patient left without being seen today d/t not feeling well.    Janene Harvey, PT, DPT 10/29/18 1:10 PM   [facility] [address]  [place], [place], [address] Phone: [phone]   Fax:  [phone]

## 2018-11-06 ENCOUNTER — Encounter: Payer: Self-pay | Admitting: Physical Therapy

## 2018-11-06 ENCOUNTER — Ambulatory Visit: Payer: Medicaid Other | Attending: Orthopaedic Surgery | Admitting: Physical Therapy

## 2018-11-06 DIAGNOSIS — M6281 Muscle weakness (generalized): Secondary | ICD-10-CM

## 2018-11-06 DIAGNOSIS — M25612 Stiffness of left shoulder, not elsewhere classified: Secondary | ICD-10-CM | POA: Diagnosis present

## 2018-11-06 DIAGNOSIS — R29898 Other symptoms and signs involving the musculoskeletal system: Secondary | ICD-10-CM

## 2018-11-06 DIAGNOSIS — M25512 Pain in left shoulder: Secondary | ICD-10-CM

## 2018-11-06 NOTE — Therapy (Signed)
[facility] [address]  [place], [place], [address] Phone: [phone]   Fax:  [phone]  Physical Therapy Discharge Summary  Patient Details  Name: April Obrien MRN: [id number] Date of Birth: 06/23/1961 Referring Provider (PT): Joni Fears, MD   Encounter Date: 11/06/2018  PT End of Session - 11/06/18 1339    Visit Number  9    Number of Visits  20    Date for PT Re-Evaluation  11/14/18    Authorization Type  Medicaid    Authorization Time Period  10/17/18 - 11/27/18    Authorization - Visit Number  2    Authorization - Number of Visits  6    PT Start Time  9774    PT Stop Time  1423    PT Time Calculation (min)  43 min    Activity Tolerance  Patient limited by pain;Patient tolerated treatment well    Behavior During Therapy  [facility] for tasks assessed/performed       Past Medical History:  Diagnosis Date  . Anxiety   . Arthritis    Acromioclavicular  . Barrett syndrome   . Chronic back pain   . Depression   . Diabetes mellitus without complication ([place])   . GERD (gastroesophageal reflux disease)   . Hepatitis    PMH: Hep A  . History of hiatal hernia   . Hypercholesterolemia   . Hypertension   . Hypothyroidism   . Torn rotator cuff    left, with impingement syndrome  . Wears glasses     Past Surgical History:  Procedure Laterality Date  . ANAL FISSURE REPAIR     s/p fissurectomy with Botox sphincterctomy 04/13/17  . BACK SURGERY    . CARPAL TUNNEL RELEASE    . CERVICAL ABLATION    . COLONOSCOPY    . CYSTECTOMY    . elbow     right lateral epicondylitis  . ESOPHAGEAL MANOMETRY N/A 07/31/2018   Procedure: ESOPHAGEAL MANOMETRY (EM);  Surgeon: Wilford Corner, MD;  Location: [facility];  Service: Endoscopy;  Laterality: N/A;  . HERNIA REPAIR    . KNEE SURGERY     arthroscopy on right knee  . [place] IMPEDANCE STUDY N/A 07/31/2018   Procedure: [place] IMPEDANCE STUDY;  Surgeon: Wilford Corner, MD;  [facility]: [facility];  Service: Endoscopy;  Laterality: N/A;  . SHOULDER ARTHROSCOPY     right   . SHOULDER ARTHROSCOPY WITH OPEN ROTATOR CUFF REPAIR AND DISTAL CLAVICLE ACROMINECTOMY Left 08/13/2018   Procedure: LEFT SHOULDER ARTHROSCOPY WITH SUBACROMIAL DECOMPRESSION, DISTAL CLAVICLE RESECTION, MINI-OPEN ROTATOR CUFF REPAIR AND POSSIBLE BICEPS TENODESIS;  Surgeon: Garald Balding, MD;  Location: [facility];  Service: Orthopedics;  Laterality: Left;    There were no vitals filed for this visit.  Subjective Assessment - 11/06/18 1252    Subjective  Reports that her neck is still bothering her, but L shoulder is doing well. Requesting to wrap up with PT today. Notes she has good ROM, and rarely has some soreness. Reports 100% improvement.     Pertinent History  chronic back pain, DM, GERD, HLD, HTN, back surgery, carpal tunnerl release, R knee arthroscopy, R shoulder arthroscopy    Diagnostic tests  06/15/18 L shoulder MRI: Partial tears of the supraspinatus and infraspinatus, AC joint OA, possible biceps tear    Patient Stated Goals  "get back to work"    Currently in Pain?  Yes    Pain Score  3  Pain Location  Arm    Pain Orientation  Left    Pain Descriptors / Indicators  Burning;Aching    Pain Type  Acute pain         [facility] PT Assessment - 11/06/18 0001      AROM   Right/Left Shoulder  Left    Left Shoulder Flexion  151 Degrees    Left Shoulder ABduction  153 Degrees    Left Shoulder Internal Rotation  52 Degrees   mild pain   Left Shoulder External Rotation  70 Degrees      PROM   Right/Left Shoulder  Left    Left Shoulder Flexion  155 Degrees    Left Shoulder ABduction  154 Degrees    Left Shoulder Internal Rotation  86 Degrees    Left Shoulder External Rotation  90 Degrees      Strength   Right/Left Shoulder  Left    Left Shoulder Flexion  4+/5    Left Shoulder ABduction  4+/5    Left Shoulder Internal Rotation  4/5   mild pain   Left Shoulder  External Rotation  4/5   mild pain                  [facility] Adult PT Treatment/Exercise - 11/06/18 0001      Shoulder Exercises: Seated   Flexion  AAROM;Left;10 reps    Flexion Limitations  10x3" flexion rollouts with orange pball to tolerance    Abduction  AAROM;Left;10 reps    ABduction Limitations  10x3" abduction rollouts with orange pball to tolerance      Shoulder Exercises: Standing   External Rotation  Strengthening;Left;10 reps;Theraband    Theraband Level (Shoulder External Rotation)  Level 2 (Red)    External Rotation Limitations  2x10; dowel under elbow; cues to maintain neutral    Internal Rotation  Strengthening;Left;10 reps;Theraband    Theraband Level (Shoulder Internal Rotation)  Level 2 (Red)    Internal Rotation Limitations  2x10; dowel under elbow; cues to maintain neutral    Flexion  Strengthening;Left;10 reps;Weights    Shoulder Flexion Weight (lbs)  3    Flexion Limitations  c/o muscle burn    Extension  Strengthening;Both;15 reps;Theraband    Theraband Level (Shoulder Extension)  Level 2 (Red)    Extension Weight (lbs)  cues for scap retraction    Row  Strengthening;Both;15 reps;Theraband    Theraband Level (Shoulder Row)  Level 3 (Green)    Row Limitations  good form    Other Standing Exercises  L shoulder scaption 2# x10   cues for proper alignment     Shoulder Exercises: Pulleys   Flexion  3 minutes    Flexion Limitations  to tolerance    Scaption  3 minutes    Scaption Limitations  to tolerance; cues to maintain shoulders in scaption      Shoulder Exercises: Stretch   Corner Stretch  30 seconds;1 rep    Corner Stretch Limitations  90/90 pec stretch at doorway to tolerance   patient reported dizziness- discontinued   Other Shoulder Stretches  L shoulder IR/ER stretch with strap 5x5" each direction to tolerance   cues to avoid pushing into pain            PT Education - 11/06/18 1339    Education Details  update and  consolidation of HEP; discussion on objective progress with PT thus far, green TB administered    Person(s) Educated  Patient    Methods  Explanation;Demonstration;Tactile cues;Verbal cues;Handout    Comprehension  Verbalized understanding;Returned demonstration       PT Short Term Goals - 11/06/18 1258      PT SHORT TERM GOAL #1   Title  Patient to be independent with initial HEP.    Time  1    Period  Weeks    Status  Achieved        PT Long Term Goals - 11/06/18 1258      PT LONG TERM GOAL #1   Title  Pt will be independent with advanced HEP program     Time  8    Period  Weeks    Status  Achieved      PT LONG TERM GOAL #2   Title  Patient to demonstrate L shoulder AROM/PROM pain-free and symmetrical to opposite UE.    Time  8    Period  Weeks    Status  Achieved   improvements demonstrated in L shoulder flexion and abduction AROM, flexion, IR, ER PROM     PT LONG TERM GOAL #3   Title  Patient to demonstrate L shoulder strength >/= 4+/5.     Time  8    Period  Weeks    Status  Partially Met   improvements demonstrated in all planes, most limited in IR/ER     PT LONG TERM GOAL #4   Title  Patient to demonstrate L UE overhead reach to retrieve 5# weight without pain.     Time  8    Period  Weeks    Status  Partially Met   able to perform with 3lbs without pain           Plan - 11/06/18 1342    Clinical Impression Statement  Patient arrived to session with report of 100% improvement in L shoulder since initial eval. Notes she has occasional soreness, but overall doing well. Requesting to wrap up with PT today. Improvements demonstrated in L shoulder flexion and abduction AROM as well as flexion, IR, ER PROM. Patient also showed improvements in all planes of strength testing today; still showing weakness in IR and ER strength. Able to perform L shoulder overhead flexion with 3lbs without pain and compensations. Worked on progressive periscapular and RTC  strengthening this session with patient demonstrating good form and tolerance. Did have c/o dizziness with pec stretch which quickly resolved with sitting rest break. Patient reporting that she believes it was d/t her neck pain. Took time to update and consolidate HEP for hopeful continued improvement in strength. Patient without complaints at end of session. D/C'd at this time.     PT Treatment/Interventions  ADLs/Self Care Home Management;Cryotherapy;Electrical Stimulation;Moist Heat;Ultrasound;Functional mobility training;Therapeutic activities;Therapeutic exercise;Manual techniques;Patient/family education;Neuromuscular re-education;Scar mobilization;Passive range of motion;Dry needling;Energy conservation;Splinting;Taping;Vasopneumatic Device    PT Next Visit Plan  D/C'd at this time    Consulted and Agree with Plan of Care  Patient       Patient will benefit from skilled therapeutic intervention in order to improve the following deficits and impairments:  Increased edema, Decreased scar mobility, Decreased knowledge of precautions, Decreased activity tolerance, Decreased strength, Impaired UE functional use, Pain, Decreased range of motion, Improper body mechanics, Postural dysfunction, Impaired flexibility  Visit Diagnosis: Acute pain of left shoulder  Stiffness of left shoulder, not elsewhere classified  Muscle weakness (generalized)  Other symptoms and signs involving the musculoskeletal system     Problem List Patient Active Problem List   Diagnosis Date Noted  .  Unspecified rotator cuff tear or rupture of right shoulder, not specified as traumatic 08/13/2018  . Impingement syndrome of left shoulder 08/13/2018  . Osteoarthritis of left AC (acromioclavicular) joint 08/13/2018  . Lumbar spinal stenosis 08/06/2014    PHYSICAL THERAPY DISCHARGE SUMMARY  Visits from Start of Care: 9  Current functional level related to goals / functional outcomes: See above clinical  impression   Remaining deficits: Decreased L shoulder IR/ER strength, difficulty with overhead lifting    Education / Equipment: HEP  Plan: Patient agrees to discharge.  Patient goals were partially met. Patient is being discharged due to being pleased with the current functional level.  ?????       Janene Harvey, PT, DPT 11/06/18 1:48 PM   [facility] [address]  [place], [place], [address] Phone: [phone]   Fax:  [phone]  Name: April Obrien MRN: [id number] Date of Birth: [birth year]

## 2019-02-24 DIAGNOSIS — K3184 Gastroparesis: Secondary | ICD-10-CM

## 2019-02-24 DIAGNOSIS — E1143 Type 2 diabetes mellitus with diabetic autonomic (poly)neuropathy: Secondary | ICD-10-CM

## 2019-02-24 HISTORY — DX: Type 2 diabetes mellitus with diabetic autonomic (poly)neuropathy: K31.84

## 2019-02-24 HISTORY — DX: Type 2 diabetes mellitus with diabetic autonomic (poly)neuropathy: E11.43

## 2019-05-29 ENCOUNTER — Other Ambulatory Visit: Payer: Self-pay

## 2019-05-29 ENCOUNTER — Ambulatory Visit (INDEPENDENT_AMBULATORY_CARE_PROVIDER_SITE_OTHER): Payer: Medicare Other | Admitting: Orthopaedic Surgery

## 2019-05-29 ENCOUNTER — Encounter: Payer: Self-pay | Admitting: Orthopaedic Surgery

## 2019-05-29 DIAGNOSIS — G5602 Carpal tunnel syndrome, left upper limb: Secondary | ICD-10-CM | POA: Diagnosis not present

## 2019-05-29 NOTE — Progress Notes (Signed)
Office Visit Note   Patient: April Obrien           Date of Birth: Dec 29, 1960           MRN: [id number] Visit Date: 05/29/2019              Requested by: Alma Friendly, MD No address on file PCP: Alma Friendly, MD   Assessment & Plan: Visit Diagnoses:  1. Carpal tunnel syndrome, left upper limb     Plan: Nearly a year status post rotator cuff tear repair left shoulder and doing relatively well.  Still has occasional problem.  She did fall on her shoulder recently and has had a little bit more pain but is able to place her arm fully overhead.  Does have some chronic issues with the cervical and lumbar spine.  Working on Hotel manager exercises.  Came to the office today for evaluation of her left carpal tunnel syndrome.  Had prior diagnostic studies demonstrating mild bilateral carpal tunnel symptoms performed in [facility] in April 2018.  We were able to retrieve these on the computer.  She has had a prior right carpal tunnel release with good relief of her pain.  She is now having more problem with her left hand with numbness and tingling in the radial 3 digits with proximal directed discomfort in the forearm.  She would like to consider left carpal tunnel release.  After much discussion we will schedule.  She also has an appointment to see a rheumatologist in the near future with multiple joint complaints and possibly even fibromyalgia.  Not sure the carpal tunnel release will alleviate all of her problems she is aware that.  However she did well on the right  Follow-Up Instructions: Return We will schedule left carpal tunnel release.   Orders:  No orders of the defined types were placed in this encounter.  No orders of the defined types were placed in this encounter.     Procedures: No procedures performed   Clinical Data: No additional findings.   Subjective: Chief Complaint  Patient presents with  . Left Shoulder - Pain  Patient presents today for left  shoulder pain. She has a history of left shoulder scope in November of 2019. She has pain in her shoulder, but she isn't here for that today.  Patient's main concern today is left carpal tunnel. She wants to talk about surgical correction today. She has pain that radiates all the way up her arm. She has pain in her fingers and weakness in her hand. She has had carpal tunnel for years now, and had correction of the carpal tunnel on the right side a couple years ago with [facility].  Apparently had some recurrent left shoulder pain after falling.  Does have chronic issues with her neck and her back and is being followed by neurosurgery.  Left shoulder is much better than it was prior to the surgery although having a bit more stiffness and soreness after a fall.  HPI  Review of Systems  Constitutional: Positive for fatigue.  Eyes: Negative for pain.  Respiratory: Negative for shortness of breath.   Cardiovascular: Positive for leg swelling.  Gastrointestinal: Positive for constipation and diarrhea.  Endocrine: Negative for cold intolerance and heat intolerance.  Genitourinary: Negative for difficulty urinating.  Musculoskeletal: Negative for joint swelling.  Skin: Negative for rash.  Allergic/Immunologic: Negative for food allergies.  Neurological: Positive for weakness.  Hematological: Does not bruise/bleed easily.  Psychiatric/Behavioral: Positive for sleep disturbance.  Objective: Vital Signs: BP 127/81   Pulse 100   Physical Exam Constitutional:      Appearance: She is well-developed.  Eyes:     Pupils: Pupils are equal, round, and reactive to light.  Pulmonary:     Effort: Pulmonary effort is normal.  Skin:    General: Skin is warm and dry.  Neurological:     Mental Status: She is alert and oriented to person, place, and time.     Ortho Exam awake alert and oriented x3.  Comfortable sitting.  She did appear depressed but no change from prior evaluations.  On examination left  shoulder she is able to place her arm fully overhead.  Minimal impingement symptoms.  Skin intact.  Old incisions of healed.  Good strength.  Left hand with positive Phalen's and Tinel's.  Thought she had a little bit atrophy of the thenar musculature.  But she had good strength with opposition of thumb to little finger.  Full range of motion of her fingers and wrist  Specialty Comments:  No specialty comments available.  Imaging: No results found.   PMFS History: Patient Active Problem List   Diagnosis Date Noted  . Carpal tunnel syndrome, left upper limb 05/29/2019  . Unspecified rotator cuff tear or rupture of right shoulder, not specified as traumatic 08/13/2018  . Impingement syndrome of left shoulder 08/13/2018  . Osteoarthritis of left AC (acromioclavicular) joint 08/13/2018  . Lumbar spinal stenosis 08/06/2014   Past Medical History:  Diagnosis Date  . Anxiety   . Arthritis    Acromioclavicular  . Barrett syndrome   . Chronic back pain   . Depression   . Diabetes mellitus without complication ([place])   . GERD (gastroesophageal reflux disease)   . Hepatitis    PMH: Hep A  . History of hiatal hernia   . Hypercholesterolemia   . Hypertension   . Hypothyroidism   . Torn rotator cuff    left, with impingement syndrome  . Wears glasses     Family History  Problem Relation Age of Onset  . Diabetes Father   . Heart disease Father     Past Surgical History:  Procedure Laterality Date  . ANAL FISSURE REPAIR     s/p fissurectomy with Botox sphincterctomy 04/13/17  . BACK SURGERY    . CARPAL TUNNEL RELEASE    . CERVICAL ABLATION    . COLONOSCOPY    . CYSTECTOMY    . elbow     right lateral epicondylitis  . ESOPHAGEAL MANOMETRY N/A 07/31/2018   Procedure: ESOPHAGEAL MANOMETRY (EM);  Surgeon: Wilford Corner, MD;  [facility];  Service: Endoscopy;  Laterality: N/A;  . HERNIA REPAIR    . KNEE SURGERY     arthroscopy on right knee  . [place] IMPEDANCE STUDY  N/A 07/31/2018   Procedure: [place] IMPEDANCE STUDY;  Surgeon: Wilford Corner, MD;  [facility];  Service: Endoscopy;  Laterality: N/A;  . SHOULDER ARTHROSCOPY     right   . SHOULDER ARTHROSCOPY WITH OPEN ROTATOR CUFF REPAIR AND DISTAL CLAVICLE ACROMINECTOMY Left 08/13/2018   Procedure: LEFT SHOULDER ARTHROSCOPY WITH SUBACROMIAL DECOMPRESSION, DISTAL CLAVICLE RESECTION, MINI-OPEN ROTATOR CUFF REPAIR AND POSSIBLE BICEPS TENODESIS;  Surgeon: Garald Balding, MD;  Location: [facility];  Service: Orthopedics;  Laterality: Left;   Social History   Occupational History  . Not on file  Tobacco Use  . Smoking status: Former Smoker    Packs/day: 1.00    Years: 26.00  Pack years: 26.00    Types: Cigarettes    Start date: 01/20/1975    Quit date: 06/25/2014    Years since quitting: 4.9  . Smokeless tobacco: Never Used  Substance and Sexual Activity  . Alcohol use: Yes    Alcohol/week: 0.0 standard drinks    Comment: rare  . Drug use: Yes    Types: Marijuana    Comment: last use October 2019  . Sexual activity: Not on file

## 2019-06-05 ENCOUNTER — Other Ambulatory Visit: Payer: Self-pay | Admitting: Orthopedic Surgery

## 2019-06-05 DIAGNOSIS — G5602 Carpal tunnel syndrome, left upper limb: Secondary | ICD-10-CM | POA: Diagnosis not present

## 2019-06-05 MED ORDER — HYDROCODONE-ACETAMINOPHEN 5-325 MG PO TABS: 1.0000 | ORAL_TABLET | ORAL | 0 refills | Status: DC | PRN

## 2019-06-12 ENCOUNTER — Ambulatory Visit (INDEPENDENT_AMBULATORY_CARE_PROVIDER_SITE_OTHER): Payer: Medicare Other | Admitting: Orthopaedic Surgery

## 2019-06-12 ENCOUNTER — Other Ambulatory Visit: Payer: Self-pay

## 2019-06-12 ENCOUNTER — Encounter: Payer: Self-pay | Admitting: Orthopaedic Surgery

## 2019-06-12 VITALS — Ht 63.5 in | Wt 183.0 lb

## 2019-06-12 DIAGNOSIS — G5602 Carpal tunnel syndrome, left upper limb: Secondary | ICD-10-CM | POA: Diagnosis not present

## 2019-06-12 NOTE — Progress Notes (Signed)
Office Visit Note   Patient: April Obrien           Date of Birth: 1961/06/04           MRN: [id number] Visit Date: 06/12/2019              Requested by: Alma Friendly, MD No address on file PCP: Alma Friendly, MD   Assessment & Plan: Visit Diagnoses:  1. Carpal tunnel syndrome, left upper limb    1 week status post left carpal tunnel release and doing well without any problems.  Will apply a new waterproof Band-Aid and volar wrist splint and return in 1 week for stitch removal  Follow-Up Instructions: Return in about 1 week (around 06/19/2019).   Orders:  No orders of the defined types were placed in this encounter.  No orders of the defined types were placed in this encounter.     Procedures: No procedures performed   Clinical Data: No additional findings.   Subjective: Chief Complaint  Patient presents with  . Left Wrist - Routine Post Op    Left carpal tunnel release DOS 06/05/2019  Patient presents today for follow up on her left wrist. She had surgery on 06/05/2019 for carpal tunnel release. She is doing well. She takes tylenol for pain.   HPI  Review of Systems   Objective: Vital Signs: Ht 5' 3.5" (1.613 m)   Wt 183 lb (83 kg)   BMI 31.91 kg/m   Physical Exam  Ortho Exam left hand carpal tunnel incision healing without problem.  No swelling of hand.  Neurologically intact.  Full fist with release.  Specialty Comments:  No specialty comments available.  Imaging: No results found.   PMFS History: Patient Active Problem List   Diagnosis Date Noted  . Carpal tunnel syndrome, left upper limb 05/29/2019  . Unspecified rotator cuff tear or rupture of right shoulder, not specified as traumatic 08/13/2018  . Impingement syndrome of left shoulder 08/13/2018  . Osteoarthritis of left AC (acromioclavicular) joint 08/13/2018  . Lumbar spinal stenosis 08/06/2014   Past Medical History:  Diagnosis Date  . Anxiety   . Arthritis    Acromioclavicular  . Barrett syndrome   . Chronic back pain   . Depression   . Diabetes mellitus without complication ([place])   . GERD (gastroesophageal reflux disease)   . Hepatitis    PMH: Hep A  . History of hiatal hernia   . Hypercholesterolemia   . Hypertension   . Hypothyroidism   . Torn rotator cuff    left, with impingement syndrome  . Wears glasses     Family History  Problem Relation Age of Onset  . Diabetes Father   . Heart disease Father     Past Surgical History:  Procedure Laterality Date  . ANAL FISSURE REPAIR     s/p fissurectomy with Botox sphincterctomy 04/13/17  . BACK SURGERY    . CARPAL TUNNEL RELEASE    . CERVICAL ABLATION    . COLONOSCOPY    . CYSTECTOMY    . elbow     right lateral epicondylitis  . ESOPHAGEAL MANOMETRY N/A 07/31/2018   Procedure: ESOPHAGEAL MANOMETRY (EM);  Surgeon: Wilford Corner, MD;  [facility];  Service: Endoscopy;  Laterality: N/A;  . HERNIA REPAIR    . KNEE SURGERY     arthroscopy on right knee  . [place] IMPEDANCE STUDY N/A 07/31/2018   Procedure: [place] IMPEDANCE STUDY;  Surgeon: Wilford Corner, MD;  Location: [facility];  Service: Endoscopy;  Laterality: N/A;  . SHOULDER ARTHROSCOPY     right   . SHOULDER ARTHROSCOPY WITH OPEN ROTATOR CUFF REPAIR AND DISTAL CLAVICLE ACROMINECTOMY Left 08/13/2018   Procedure: LEFT SHOULDER ARTHROSCOPY WITH SUBACROMIAL DECOMPRESSION, DISTAL CLAVICLE RESECTION, MINI-OPEN ROTATOR CUFF REPAIR AND POSSIBLE BICEPS TENODESIS;  Surgeon: Garald Balding, MD;  Location: [facility];  Service: Orthopedics;  Laterality: Left;   Social History   Occupational History  . Not on file  Tobacco Use  . Smoking status: Former Smoker    Packs/day: 1.00    Years: 26.00    Pack years: 26.00    Types: Cigarettes    Start date: 01/20/1975    Quit date: 06/25/2014    Years since quitting: 4.9  . Smokeless tobacco: Never Used  Substance and Sexual Activity  . Alcohol use: Yes    Alcohol/week: 0.0  standard drinks    Comment: rare  . Drug use: Yes    Types: Marijuana    Comment: last use October 2019  . Sexual activity: Not on file

## 2019-06-13 ENCOUNTER — Inpatient Hospital Stay: Payer: Medicare Other | Admitting: Orthopaedic Surgery

## 2019-06-19 ENCOUNTER — Ambulatory Visit (INDEPENDENT_AMBULATORY_CARE_PROVIDER_SITE_OTHER): Payer: Medicare Other | Admitting: Orthopaedic Surgery

## 2019-06-19 ENCOUNTER — Encounter: Payer: Self-pay | Admitting: Orthopaedic Surgery

## 2019-06-19 ENCOUNTER — Other Ambulatory Visit: Payer: Self-pay

## 2019-06-19 VITALS — BP 118/77 | HR 91 | Ht 63.5 in | Wt 183.0 lb

## 2019-06-19 DIAGNOSIS — G5602 Carpal tunnel syndrome, left upper limb: Secondary | ICD-10-CM

## 2019-06-19 NOTE — Progress Notes (Signed)
Office Visit Note   Patient: April Obrien           Date of Birth: 12-Nov-1960           MRN: [id number] Visit Date: 06/19/2019              Requested by: Alma Friendly, MD No address on file PCP: Alma Friendly, MD   Assessment & Plan: Visit Diagnoses:  1. Carpal tunnel syndrome, left upper limb     Plan: 2 weeks status post right carpal tunnel release.  Stitches removed.  Continue with splint.  Office 2 weeks.  Doing well without the preoperative symptoms.  Has not been working so does not need a note  Follow-Up Instructions: Return in about 2 weeks (around 07/03/2019).   Orders:  No orders of the defined types were placed in this encounter.  No orders of the defined types were placed in this encounter.     Procedures: No procedures performed   Clinical Data: No additional findings.   Subjective: Chief Complaint  Patient presents with  . Left Wrist - Routine Post Op    Left carpal tunnel release DOS 06/05/2019  Patient presents today two weeks out from left carpal tunnel release.Her surgery was on 06/05/2019. She has been wearing a volar wrist splint. Patient states that her wrist is feeling well. She said that her wrist feels tight with wearing the splint. She has no numbness or tingling in her fingers.  HPI  Review of Systems   Objective: Vital Signs: BP 118/77   Pulse 91   Ht 5' 3.5" (1.613 m)   Wt 183 lb (83 kg)   BMI 31.91 kg/m   Physical Exam  Ortho Exam left carpal tunnel incision healing without problem.  Stitches removed.  Neurologically intact able to make a full fist minimal tenderness about the incision  Specialty Comments:  No specialty comments available.  Imaging: No results found.   PMFS History: Patient Active Problem List   Diagnosis Date Noted  . Carpal tunnel syndrome, left upper limb 05/29/2019  . Unspecified rotator cuff tear or rupture of right shoulder, not specified as traumatic 08/13/2018  . Impingement  syndrome of left shoulder 08/13/2018  . Osteoarthritis of left AC (acromioclavicular) joint 08/13/2018  . Lumbar spinal stenosis 08/06/2014   Past Medical History:  Diagnosis Date  . Anxiety   . Arthritis    Acromioclavicular  . Barrett syndrome   . Chronic back pain   . Depression   . Diabetes mellitus without complication ([place])   . GERD (gastroesophageal reflux disease)   . Hepatitis    PMH: Hep A  . History of hiatal hernia   . Hypercholesterolemia   . Hypertension   . Hypothyroidism   . Torn rotator cuff    left, with impingement syndrome  . Wears glasses     Family History  Problem Relation Age of Onset  . Diabetes Father   . Heart disease Father     Past Surgical History:  Procedure Laterality Date  . ANAL FISSURE REPAIR     s/p fissurectomy with Botox sphincterctomy 04/13/17  . BACK SURGERY    . CARPAL TUNNEL RELEASE    . CERVICAL ABLATION    . COLONOSCOPY    . CYSTECTOMY    . elbow     right lateral epicondylitis  . ESOPHAGEAL MANOMETRY N/A 07/31/2018   Procedure: ESOPHAGEAL MANOMETRY (EM);  Surgeon: Wilford Corner, MD;  [facility];  Service: Endoscopy;  Laterality:  N/A;  . HERNIA REPAIR    . KNEE SURGERY     arthroscopy on right knee  . [place] IMPEDANCE STUDY N/A 07/31/2018   Procedure: [place] IMPEDANCE STUDY;  Surgeon: Wilford Corner, MD;  [facility];  Service: Endoscopy;  Laterality: N/A;  . SHOULDER ARTHROSCOPY     right   . SHOULDER ARTHROSCOPY WITH OPEN ROTATOR CUFF REPAIR AND DISTAL CLAVICLE ACROMINECTOMY Left 08/13/2018   Procedure: LEFT SHOULDER ARTHROSCOPY WITH SUBACROMIAL DECOMPRESSION, DISTAL CLAVICLE RESECTION, MINI-OPEN ROTATOR CUFF REPAIR AND POSSIBLE BICEPS TENODESIS;  Surgeon: Garald Balding, MD;  Location: [place];  Service: Orthopedics;  Laterality: Left;   Social History   Occupational History  . Not on file  Tobacco Use  . Smoking status: Former Smoker    Packs/day: 1.00    Years: 26.00    Pack years: 26.00     Types: Cigarettes    Start date: 01/20/1975    Quit date: 06/25/2014    Years since quitting: 4.9  . Smokeless tobacco: Never Used  Substance and Sexual Activity  . Alcohol use: Yes    Alcohol/week: 0.0 standard drinks    Comment: rare  . Drug use: Yes    Types: Marijuana    Comment: last use October 2019  . Sexual activity: Not on file

## 2019-07-03 ENCOUNTER — Ambulatory Visit (INDEPENDENT_AMBULATORY_CARE_PROVIDER_SITE_OTHER): Payer: Medicare Other | Admitting: Orthopaedic Surgery

## 2019-07-03 ENCOUNTER — Encounter: Payer: Self-pay | Admitting: Orthopaedic Surgery

## 2019-07-03 ENCOUNTER — Other Ambulatory Visit: Payer: Self-pay

## 2019-07-03 VITALS — BP 147/119 | HR 91 | Ht 63.5 in | Wt 183.0 lb

## 2019-07-03 DIAGNOSIS — G5602 Carpal tunnel syndrome, left upper limb: Secondary | ICD-10-CM

## 2019-07-03 NOTE — Progress Notes (Signed)
Office Visit Note   Patient: April Obrien           Date of Birth: [birth year]           MRN: [id number] Visit Date: 07/03/2019              Requested by: Alma Friendly, MD No address on file PCP: Alma Friendly, MD   Assessment & Plan: Visit Diagnoses:  1. Carpal tunnel syndrome, left upper limb     Plan: 1 month status post left carpal tunnel release.  [name] can tell a difference in her hand that she is not having to wake up at night and shake her hand and having less numbness.  She does have an ongoing issue with her cervical spine and being followed by 1 of the spine surgeons at [facility].  One small area of scabbing at the superior end of the incision that several millimeters in diameter but good granulation tissue I tried to superficially explore but did not find any residual stitch and just applied a small waterproof Band-Aid.  We will see her again in a month  Follow-Up Instructions: Return in about 1 month (around 08/03/2019).   Orders:  No orders of the defined types were placed in this encounter.  No orders of the defined types were placed in this encounter.     Procedures: No procedures performed   Clinical Data: No additional findings.   Subjective: Chief Complaint  Patient presents with  . Left Wrist - Routine Post Op    Left carpal tunnel release DOS 06/05/19  Patient presents today for a two week follow up on her left wrist. She is now one month out from left carpal tunnel release. Her surgery was on 06/05/2019. Patient states that she is doing okay. She takes Tylenol as needed for pain. She has been having pain down her arm, but feels like it is coming from her neck. She is being treated for neck pain by [facility].   HPI  Review of Systems   Objective: Vital Signs: BP (!) 147/119   Pulse 91   Ht 5' 3.5" (1.613 m)   Wt 183 lb (83 kg)   BMI 31.91 kg/m   Physical Exam  Ortho Exam full range of motion of left hand.  Small area of relation  tissue at the distal aspect of the incision.  Not painful and no drainage.  I asked superficially explored and did not find any residual stitch.  Applied a Band-Aid.  We will check her back in a month  Specialty Comments:  No specialty comments available.  Imaging: No results found.   PMFS History: Patient Active Problem List   Diagnosis Date Noted  . Carpal tunnel syndrome, left upper limb 05/29/2019  . Unspecified rotator cuff tear or rupture of right shoulder, not specified as traumatic 08/13/2018  . Impingement syndrome of left shoulder 08/13/2018  . Osteoarthritis of left AC (acromioclavicular) joint 08/13/2018  . Lumbar spinal stenosis 08/06/2014   Past Medical History:  Diagnosis Date  . Anxiety   . Arthritis    Acromioclavicular  . Barrett syndrome   . Chronic back pain   . Depression   . Diabetes mellitus without complication ([place])   . GERD (gastroesophageal reflux disease)   . Hepatitis    PMH: Hep A  . History of hiatal hernia   . Hypercholesterolemia   . Hypertension   . Hypothyroidism   . Torn rotator cuff    left, with impingement syndrome  .  Wears glasses     Family History  Problem Relation Age of Onset  . Diabetes Father   . Heart disease Father     Past Surgical History:  Procedure Laterality Date  . ANAL FISSURE REPAIR     s/p fissurectomy with Botox sphincterctomy 04/13/17  . BACK SURGERY    . CARPAL TUNNEL RELEASE    . CERVICAL ABLATION    . COLONOSCOPY    . CYSTECTOMY    . elbow     right lateral epicondylitis  . ESOPHAGEAL MANOMETRY N/A 07/31/2018   Procedure: ESOPHAGEAL MANOMETRY (EM);  Surgeon: Wilford Corner, MD;  [facility];  Service: Endoscopy;  Laterality: N/A;  . HERNIA REPAIR    . KNEE SURGERY     arthroscopy on right knee  . [place] IMPEDANCE STUDY N/A 07/31/2018   Procedure: [place] IMPEDANCE STUDY;  Surgeon: Wilford Corner, MD;  [facility];  Service: Endoscopy;  Laterality: N/A;  . SHOULDER ARTHROSCOPY      right   . SHOULDER ARTHROSCOPY WITH OPEN ROTATOR CUFF REPAIR AND DISTAL CLAVICLE ACROMINECTOMY Left 08/13/2018   Procedure: LEFT SHOULDER ARTHROSCOPY WITH SUBACROMIAL DECOMPRESSION, DISTAL CLAVICLE RESECTION, MINI-OPEN ROTATOR CUFF REPAIR AND POSSIBLE BICEPS TENODESIS;  Surgeon: Garald Balding, MD;  Location: [place];  Service: Orthopedics;  Laterality: Left;   Social History   Occupational History  . Not on file  Tobacco Use  . Smoking status: Former Smoker    Packs/day: 1.00    Years: 26.00    Pack years: 26.00    Types: Cigarettes    Start date: 01/20/1975    Quit date: 06/25/2014    Years since quitting: 5.0  . Smokeless tobacco: Never Used  Substance and Sexual Activity  . Alcohol use: Yes    Alcohol/week: 0.0 standard drinks    Comment: rare  . Drug use: Yes    Types: Marijuana    Comment: last use October 2019  . Sexual activity: Not on file

## 2019-08-06 ENCOUNTER — Encounter: Payer: Self-pay | Admitting: Orthopaedic Surgery

## 2019-08-06 ENCOUNTER — Ambulatory Visit (INDEPENDENT_AMBULATORY_CARE_PROVIDER_SITE_OTHER): Payer: Medicare Other | Admitting: Orthopaedic Surgery

## 2019-08-06 ENCOUNTER — Other Ambulatory Visit: Payer: Self-pay

## 2019-08-06 DIAGNOSIS — G5602 Carpal tunnel syndrome, left upper limb: Secondary | ICD-10-CM

## 2019-08-06 NOTE — Progress Notes (Signed)
Office Visit Note   Patient: April Obrien           Date of Birth: Apr 14, 1961           MRN: [id number] Visit Date: 08/06/2019              Requested by: Alma Friendly, MD [address] [place],  [place] [address] PCP: Alma Friendly, MD   Assessment & Plan: Visit Diagnoses:  1. Carpal tunnel syndrome, left upper limb     Plan: 2 months status post left carpal tunnel release and doing well.  Does have some thickening around the scar and will use either med derma or vitamin E .  Does have Dupuytren's contracture which is a minor nuisance at this point.  No significant contractures.  We will plan to see back as needed.  Doing well  Follow-Up Instructions: Return if symptoms worsen or fail to improve.   Orders:  No orders of the defined types were placed in this encounter.  No orders of the defined types were placed in this encounter.     Procedures: No procedures performed   Clinical Data: No additional findings.   Subjective: Chief Complaint  Patient presents with  . Left Hand - Follow-up    Follow up, incision check, continues to have have burning sensation in fingers, at times very painful.  Less problem with her left hand after the carpal tunnel release.  Still has an ongoing issue with the cervical spine that might be causing some referred pain to her left upper extremity  HPI  Review of Systems   Objective: Vital Signs: There were no vitals taken for this visit.  Physical Exam  Ortho Exam left carpal tunnel incision is healing without problem except that it is a little thickened and she will try either vitamin E or med derma.  Good opposition of thumb to little finger.  Normal sensibility tips of fingers.  Dupuytren's contractures are unchanged  Specialty Comments:  No specialty comments available.  Imaging: No results found.   PMFS History: Patient Active Problem List   Diagnosis Date Noted  . Carpal tunnel syndrome, left upper limb  05/29/2019  . Unspecified rotator cuff tear or rupture of right shoulder, not specified as traumatic 08/13/2018  . Impingement syndrome of left shoulder 08/13/2018  . Osteoarthritis of left AC (acromioclavicular) joint 08/13/2018  . Lumbar spinal stenosis 08/06/2014   Past Medical History:  Diagnosis Date  . Anxiety   . Arthritis    Acromioclavicular  . Barrett syndrome   . Chronic back pain   . Depression   . Diabetes mellitus without complication ([place])   . GERD (gastroesophageal reflux disease)   . Hepatitis    PMH: Hep A  . History of hiatal hernia   . Hypercholesterolemia   . Hypertension   . Hypothyroidism   . Torn rotator cuff    left, with impingement syndrome  . Wears glasses     Family History  Problem Relation Age of Onset  . Diabetes Father   . Heart disease Father     Past Surgical History:  Procedure Laterality Date  . ANAL FISSURE REPAIR     s/p fissurectomy with Botox sphincterctomy 04/13/17  . BACK SURGERY    . CARPAL TUNNEL RELEASE    . CERVICAL ABLATION    . COLONOSCOPY    . CYSTECTOMY    . elbow     right lateral epicondylitis  . ESOPHAGEAL MANOMETRY N/A 07/31/2018   Procedure:  ESOPHAGEAL MANOMETRY (EM);  Surgeon: Wilford Corner, MD;  [facility];  Service: Endoscopy;  Laterality: N/A;  . HERNIA REPAIR    . KNEE SURGERY     arthroscopy on right knee  . [place] IMPEDANCE STUDY N/A 07/31/2018   Procedure: [place] IMPEDANCE STUDY;  Surgeon: Wilford Corner, MD;  [facility];  Service: Endoscopy;  Laterality: N/A;  . SHOULDER ARTHROSCOPY     right   . SHOULDER ARTHROSCOPY WITH OPEN ROTATOR CUFF REPAIR AND DISTAL CLAVICLE ACROMINECTOMY Left 08/13/2018   Procedure: LEFT SHOULDER ARTHROSCOPY WITH SUBACROMIAL DECOMPRESSION, DISTAL CLAVICLE RESECTION, MINI-OPEN ROTATOR CUFF REPAIR AND POSSIBLE BICEPS TENODESIS;  Surgeon: Garald Balding, MD;  Location: [place];  Service: Orthopedics;  Laterality: Left;   Social History   Occupational  History  . Not on file  Tobacco Use  . Smoking status: Former Smoker    Packs/day: 1.00    Years: 26.00    Pack years: 26.00    Types: Cigarettes    Start date: 01/20/1975    Quit date: 06/25/2014    Years since quitting: 5.1  . Smokeless tobacco: Never Used  Substance and Sexual Activity  . Alcohol use: Yes    Alcohol/week: 0.0 standard drinks    Comment: rare  . Drug use: Yes    Types: Marijuana    Comment: last use October 2019  . Sexual activity: Not on file     Garald Balding, MD   Note - This record has been created using [employer].  Chart creation errors have been sought, but may not always  have been located. Such creation errors do not reflect on  the standard of medical care.

## 2020-01-01 ENCOUNTER — Other Ambulatory Visit: Payer: Self-pay | Admitting: Gastroenterology

## 2020-01-01 DIAGNOSIS — R1084 Generalized abdominal pain: Secondary | ICD-10-CM

## 2020-01-09 ENCOUNTER — Ambulatory Visit
Admission: RE | Admit: 2020-01-09 | Discharge: 2020-01-09 | Disposition: A | Discharge status: Home / Self Care | Payer: Medicare Other | Source: Ambulatory Visit | Attending: Gastroenterology | Admitting: Gastroenterology

## 2020-01-09 DIAGNOSIS — R1084 Generalized abdominal pain: Secondary | ICD-10-CM

## 2020-01-09 MED ORDER — IOPAMIDOL (ISOVUE-300) INJECTION 61%
100.0000 mL | Freq: Once | INTRAVENOUS | Status: AC | PRN
  Administered 2020-01-09: 100 mL via INTRAVENOUS

## 2020-01-20 ENCOUNTER — Ambulatory Visit: Payer: Medicare Other | Admitting: Orthopaedic Surgery

## 2020-02-02 ENCOUNTER — Other Ambulatory Visit (HOSPITAL_COMMUNITY): Payer: Self-pay | Admitting: Gastroenterology

## 2020-02-02 ENCOUNTER — Other Ambulatory Visit: Payer: Self-pay | Admitting: Gastroenterology

## 2020-02-02 DIAGNOSIS — R140 Abdominal distension (gaseous): Secondary | ICD-10-CM

## 2020-02-02 DIAGNOSIS — R1084 Generalized abdominal pain: Secondary | ICD-10-CM

## 2020-02-12 ENCOUNTER — Ambulatory Visit (HOSPITAL_COMMUNITY)
Admission: RE | Admit: 2020-02-12 | Discharge: 2020-02-12 | Disposition: A | Discharge status: Home / Self Care | Payer: Medicare Other | Source: Ambulatory Visit | Attending: Gastroenterology | Admitting: Gastroenterology

## 2020-02-12 ENCOUNTER — Other Ambulatory Visit: Payer: Self-pay

## 2020-02-12 DIAGNOSIS — R1084 Generalized abdominal pain: Secondary | ICD-10-CM | POA: Insufficient documentation

## 2020-02-12 DIAGNOSIS — R140 Abdominal distension (gaseous): Secondary | ICD-10-CM | POA: Diagnosis present

## 2020-02-12 MED ORDER — TECHNETIUM TC 99M SULFUR COLLOID: 2.1100 | Freq: Once | INTRAVENOUS | Status: DC | PRN

## 2020-02-12 MED ORDER — TECHNETIUM TC 99M SULFUR COLLOID
2.1100 | Freq: Once | INTRAVENOUS | Status: AC | PRN
  Administered 2020-02-12: 2.11 via ORAL

## 2020-06-24 ENCOUNTER — Ambulatory Visit: Payer: Self-pay | Admitting: Surgery

## 2020-06-24 NOTE — H&P (Signed)
Surgical Evaluation  HPI: This is a pleasant 59 year old woman who presents today with chronic right upper quadrant pain, nausea, vomiting.  She has a complex gastrointestinal history.  She had a long-standing history of insulin dependent diabetes, which was uncontrolled until recently.  She has been having digestive issues and GI functional issues for many years, but for at least the last year she has been having worsening symptoms including postprandial nausea, bloating, sometimes emesis, and describes a focus of bloating in the right upper quadrant, right upper quadrant pain which radiates to the back and sometimes in the lower abdomen. She has gastroparesis documented on gastric emptying study earlier this year.  A CT scan before that was within normal limits.  She also has a history of chronic constipation and history of anal fissure requiring chemical sphincterotomy.  She has undergone a pretty exhaustive workup and has been diagnosed with borderline pancreatic insufficiency as well as some type of intestinal bacterial overgrowth based on a hydrogen breath test, and is taking Creon as well as has been treated with antibiotics.  She underwent a right upper quadrant ultrasound which was negative for gallstones or other anatomic pathology although the patient reports to me that she has fatty liver.  She had a HIDA scan which was normal and documented ejection fraction around 60%.  She has eliminated several types of food from her diet and has done her best to be compliant with recommendations regarding eating patterns for the gastroparesis.  Despite this she continues to have symptoms, and has been referred here to discuss possible cholecystectomy.  She has never had any prior abdominal surgery.  She denies any known heart disease or symptoms.  Allergies  Allergen Reactions   Trileptal [Oxcarbazepine]     vomiting   Oxycodone Itching    Past Medical History:  Diagnosis Date   Anxiety    Arthritis     Acromioclavicular   Barrett syndrome    Chronic back pain    Depression    Diabetes mellitus without complication ([facility])    GERD (gastroesophageal reflux disease)    Hepatitis    PMH: Hep A   History of hiatal hernia    Hypercholesterolemia    Hypertension    Hypothyroidism    Torn rotator cuff    left, with impingement syndrome   Wears glasses     Past Surgical History:  Procedure Laterality Date   ANAL FISSURE REPAIR     s/p fissurectomy with Botox sphincterctomy 04/13/17   BACK SURGERY     CARPAL TUNNEL RELEASE     CERVICAL ABLATION     COLONOSCOPY     CYSTECTOMY     elbow     right lateral epicondylitis   ESOPHAGEAL MANOMETRY N/A 07/31/2018   Procedure: ESOPHAGEAL MANOMETRY (EM);  Surgeon: Wilford Corner, MD;  [facility];  Service: Endoscopy;  Laterality: N/A;   HERNIA REPAIR     KNEE SURGERY     arthroscopy on right knee   [place] IMPEDANCE STUDY N/A 07/31/2018   Procedure: [place] IMPEDANCE STUDY;  Surgeon: Wilford Corner, MD;  [facility];  Service: Endoscopy;  Laterality: N/A;   SHOULDER ARTHROSCOPY     right    SHOULDER ARTHROSCOPY WITH OPEN ROTATOR CUFF REPAIR AND DISTAL CLAVICLE ACROMINECTOMY Left 08/13/2018   Procedure: LEFT SHOULDER ARTHROSCOPY WITH SUBACROMIAL DECOMPRESSION, DISTAL CLAVICLE RESECTION, MINI-OPEN ROTATOR CUFF REPAIR AND POSSIBLE BICEPS TENODESIS;  Surgeon: Garald Balding, MD;  Location: [place];  Service: Orthopedics;  Laterality: Left;  Family History  Problem Relation Age of Onset   Diabetes Father    Heart disease Father     Social History   Socioeconomic History   Marital status: Single    Spouse name: Not on file   Number of children: Not on file   Years of education: Not on file   Highest education level: Not on file  Occupational History   Not on file  Tobacco Use   Smoking status: Former Smoker    Packs/day: 1.00    Years: 26.00    Pack years: 26.00    Types: Cigarettes    Start date: 01/20/1975     Quit date: 06/25/2014    Years since quitting: 6.0   Smokeless tobacco: Never Used  Vaping Use   Vaping Use: Former  Substance and Sexual Activity   Alcohol use: Yes    Alcohol/week: 0.0 standard drinks    Comment: rare   Drug use: Yes    Types: Marijuana    Comment: last use October 2019   Sexual activity: Not on file  Other Topics Concern   Not on file  Social History Narrative   Not on file   Social Determinants of Health   Financial Resource Strain:    Difficulty of Paying Living Expenses: Not on file  Food Insecurity:    Worried About [place] in the Last Year: Not on file   Ran Out of Food in the Last Year: Not on file  Transportation Needs:    Lack of Transportation (Medical): Not on file   Lack of Transportation (Non-Medical): Not on file  Physical Activity:    Days of Exercise per Week: Not on file   Minutes of Exercise per Session: Not on file  Stress:    Feeling of Stress : Not on file  Social Connections:    Frequency of Communication with Friends and Family: Not on file   Frequency of Social Gatherings with Friends and Family: Not on file   Attends Religious Services: Not on file   Active Member of Clubs or Organizations: Not on file   Attends Archivist Meetings: Not on file   Marital Status: Not on file    Current Outpatient Medications on File Prior to Visit  Medication Sig Dispense Refill   ALPRAZolam (XANAX) 1 MG tablet Take 1 mg by mouth at bedtime as needed for anxiety.     atorvastatin (LIPITOR) 80 MG tablet Take 80 mg by mouth daily.      cyclobenzaprine (FLEXERIL) 10 MG tablet Take 1 tablet (10 mg total) by mouth 3 (three) times daily as needed for muscle spasms. 30 tablet 0   dexlansoprazole (DEXILANT) 60 MG capsule Take 60 mg by mouth daily.      FLUoxetine (PROZAC) 20 MG tablet Take 20 mg by mouth 2 (two) times daily.      HYDROcodone-acetaminophen (NORCO) 5-325 MG tablet Take 1 tablet by mouth every 4 (four) hours as  needed for moderate pain or severe pain. 40 tablet 0   insulin glargine (LANTUS) 100 UNIT/ML injection Inject 70 Units into the skin at bedtime.     levothyroxine (SYNTHROID, LEVOTHROID) 112 MCG tablet Take 112 mcg by mouth daily before breakfast.      lisinopril (PRINIVIL,ZESTRIL) 5 MG tablet Take 5 mg by mouth daily.      metFORMIN (GLUCOPHAGE) 1000 MG tablet Take 1,000 mg by mouth 2 (two) times daily with a meal.     ondansetron (ZOFRAN ODT)  8 MG disintegrating tablet Take 1 tablet (8 mg total) by mouth every 8 (eight) hours as needed for nausea or vomiting. 10 tablet 0   OXcarbazepine (TRILEPTAL) 150 MG tablet Take 75 mg by mouth 2 (two) times daily.     No current facility-administered medications on file prior to visit.    Review of Systems: a complete, 10pt review of systems was completed with pertinent positives and negatives as documented in the HPI  Physical Exam: Vitals  Weight: 160.13 lb   Height: 64 in  Body Surface Area: 1.78 m   Body Mass Index: 27.49 kg/m   Temp.: 97.8 F    Pulse: 108 (Regular)    P.OX: 95% (Room air) BP: 120/90(Sitting, Left Arm, Standard)   Alert and cooperative Unlabored respirations Status post bilateral mastectomy Abdomen is soft, nondistended, no palpable hepatosplenomegaly, there is tenderness along the epigastrium and right subcostal region extending along the right hemiabdomen without peritoneal signs, no mass   CBC Latest Ref Rng & Units 08/12/2018 07/09/2018 11/19/2014  WBC 4.0 - 10.5 K/uL 9.9 9.2 8.4  Hemoglobin 12.0 - 15.0 g/dL 12.7 12.3 14.6  Hematocrit 36 - 46 % 40.1 38.3 43.2  Platelets 150 - 400 K/uL 459(H) 454(H) 414(H)    CMP Latest Ref Rng & Units 08/12/2018 07/09/2018 11/19/2014  Glucose 70 - 99 mg/dL 240(H) 106(H) 181(H)  BUN 6 - 20 mg/dL 17 14 13   Creatinine 0.44 - 1.00 mg/dL 0.95 0.82 0.8  Sodium 135 - 145 mmol/L 134(L) 138 142  Potassium 3.5 - 5.1 mmol/L 4.0 4.0 4.0  Chloride 98 - 111 mmol/L 101 104 97(L)  CO2 22 - 32  mmol/L 22 25 26   Calcium 8.9 - 10.3 mg/dL 9.3 9.1 9.4  Total Protein 6.5 - 8.1 g/dL - 7.3 7.7  Total Bilirubin 0.3 - 1.2 mg/dL - 0.1(L) 0.70  Alkaline Phos 38 - 126 U/L - 63 66  AST 15 - 41 U/L - 21 23  ALT 0 - 44 U/L - 21 19    No results found for: INR, PROTIME  Imaging: No results found.   A/P: ABDOMINAL PAIN, CHRONIC, RIGHT UPPER QUADRANT (R10.11) Story: I had a long discussion with the patient and her mother regarding her diagnoses and symptoms. We discussed the anatomy and function of the biliary tract. I discussed that in the setting of multiple other GI issues and lack of objective data demonstrating gallbladder pathology, I cannot guarantee or even confidently suggest that she will have any improvement from undergoing cholecystectomy. I advised her that I am hesitant to offer her surgery due to this in addition to the fact that surgery is not without risk- and advised in detail that this includes bleeding, infection, pain, scarring, intraabdominal injury specifically to the common bile duct and sequelae, bile leak and sequelae, conversion to open surgery, failure to resolve symptoms, blood clots/ pulmonary embolus, heart attack, pneumonia, stroke, death, and possibly worsening of her GI tract function/ symptoms. Questions welcomed and answered to patient's satisfaction. She expressed understanding, and strongly desires to proceed with laparoscopic cholecystectomy understanding that it unlikely to be a panacea for her GI issues or pain.    Patient Active Problem List   Diagnosis Date Noted   Carpal tunnel syndrome, left upper limb 05/29/2019   Unspecified rotator cuff tear or rupture of right shoulder, not specified as traumatic 08/13/2018   Impingement syndrome of left shoulder 08/13/2018   Osteoarthritis of left AC (acromioclavicular) joint 08/13/2018   Lumbar spinal stenosis 08/06/2014  Romana Juniper, MD [facility], [place]  See AMION to contact appropriate  on-call provider

## 2020-08-04 ENCOUNTER — Other Ambulatory Visit (HOSPITAL_COMMUNITY): Payer: Medicare Other

## 2020-08-05 NOTE — Patient Instructions (Addendum)
DUE TO COVID-19 ONLY ONE VISITOR IS ALLOWED TO COME WITH YOU AND STAY IN THE WAITING ROOM ONLY DURING PRE OP AND PROCEDURE DAY OF SURGERY. THE 1 VISITOR  MAY VISIT WITH YOU AFTER SURGERY IN YOUR PRIVATE ROOM DURING VISITING HOURS ONLY!  YOU NEED TO HAVE A COVID 19 TEST ON  11/13_______ @_8 :30______, THIS TEST MUST BE DONE BEFORE SURGERY,  COVID TESTING SITE [place] [id number], IT IS ON THE RIGHT GOING OUT [address] APPROXIMATELY  2 MINUTES PAST [employer] ON THE RIGHT. ONCE YOUR COVID TEST IS COMPLETED,  PLEASE BEGIN THE QUARANTINE INSTRUCTIONS AS OUTLINED IN YOUR HANDOUT.                April Obrien   Your procedure is scheduled on: 08/11/20   Report to [facility] Main  Entrance   Report to admitting at  6:30 AM     Call this number if you have problems the morning of surgery [phone]    Remember: Do not eat food or drink liquids :After Midnight.   BRUSH YOUR TEETH MORNING OF SURGERY AND RINSE YOUR MOUTH OUT, NO CHEWING GUM CANDY OR MINTS.     Take these medicines the morning of surgery with A SIP OF WATER: Wellbutrin, Prozac, Synthroid  DO NOT TAKE ANY DIABETIC MEDICATIONS DAY OF YOUR SURGERY    How to Manage Your Diabetes Before and After Surgery  Why is it important to control my blood sugar before and after surgery? . Improving blood sugar levels before and after surgery helps healing and can limit problems. . A way of improving blood sugar control is eating a healthy diet by: o  Eating less sugar and carbohydrates o  Increasing activity/exercise o  Talking with your doctor about reaching your blood sugar goals . High blood sugars (greater than 180 mg/dL) can raise your risk of infections and slow your recovery, so you will need to focus on controlling your diabetes during the weeks before surgery. . Make sure that the doctor who takes care of your diabetes knows about your planned surgery including the date and  location.  How do I manage my blood sugar before surgery? . Check your blood sugar at least 4 times a day, starting 2 days before surgery, to make sure that the level is not too high or low. o Check your blood sugar the morning of your surgery when you wake up and every 2 hours until you get to the Short Stay unit. . If your blood sugar is less than 70 mg/dL, you will need to treat for low blood sugar: o Do not take insulin. o Treat a low blood sugar (less than 70 mg/dL) with  cup of clear juice (cranberry or apple), 4 glucose tablets, OR glucose gel. o Recheck blood sugar in 15 minutes after treatment (to make sure it is greater than 70 mg/dL). If your blood sugar is not greater than 70 mg/dL on recheck, call [phone] for further instructions. . Report your blood sugar to the short stay nurse when you get to Short Stay.  . If you are admitted to the hospital after surgery: o Your blood sugar will be checked by the staff and you will probably be given insulin after surgery (instead of oral diabetes medicines) to make sure you have good blood sugar levels. o The goal for blood sugar control after surgery is 80-180 mg/dL.   WHAT DO I DO ABOUT MY DIABETES MEDICATION?  Marland Kitchen  Do not take oral diabetes medicines (pills) the morning of surgery.  . THE NIGHT BEFORE SURGERY, take 5   units of  Lantus   insulin.       . THE MORNING OF SURGERY, take 0   units of   insulin.  .                            You may not have any metal on your body including hair pins and              piercings  Do not wear jewelry, make-up, lotions, powders or perfumes, deodorant             Do not wear nail polish on your fingernails.  Do not shave  48 hours prior to surgery.                 Do not bring valuables to the hospital. [place].  Contacts, dentures or bridgework may not be worn into surgery.       Patients discharged the day of surgery will not be  allowed to drive home.   IF YOU ARE HAVING SURGERY AND GOING HOME THE SAME DAY, YOU MUST HAVE AN ADULT TO DRIVE YOU HOME AND BE WITH YOU FOR 24 HOURS.   YOU MAY GO HOME BY TAXI OR UBER OR ORTHERWISE, BUT AN ADULT MUST ACCOMPANY YOU HOME AND STAY WITH YOU FOR 24 HOURS.  Name and phone number of your driver:  Special Instructions: N/A              Please read over the following fact sheets you were given: _____________________________________________________________________             [facility] - Preparing for Surgery Before surgery, you can play an important role .  Because skin is not sterile, your skin needs to be as free of germs as possible.   You can reduce the number of germs on your skin by washing with CHG (chlorahexidine gluconate) soap before surgery .  CHG is an antiseptic cleaner which kills germs and bonds with the skin to continue killing germs even after washing. Please DO NOT use if you have an allergy to CHG or antibacterial soaps.   If your skin becomes reddened/irritated stop using the CHG and inform your nurse when you arrive at Short Stay. Do not shave (including legs and underarms) for at least 48 hours prior to the first CHG shower.  . Please follow these instructions carefully:  1.  Shower with CHG Soap the night before surgery and the  morning of Surgery.  2.  If you choose to wash your hair, wash your hair first as usual with your  normal  shampoo.  3.  After you shampoo, rinse your hair and body thoroughly to remove the  shampoo.                                        4.  Use CHG as you would any other liquid soap.  You can apply chg directly  to the skin and wash                       Gently with  a scrungie or clean washcloth.  5.  Apply the CHG Soap to your body ONLY FROM THE NECK DOWN.   Do not use on face/ open                           Wound or open sores. Avoid contact with eyes, ears mouth and genitals (private parts).                       Wash  face,  Genitals (private parts) with your normal soap.             6.  Wash thoroughly, paying special attention to the area where your surgery  will be performed.  7.  Thoroughly rinse your body with warm water from the neck down.  8.  DO NOT shower/wash with your normal soap after using and rinsing off  the CHG Soap.             9.  Pat yourself dry with a clean towel.            10.  Wear clean pajamas.            11.  Place clean sheets on your bed the night of your first shower and do not  sleep with pets. Day of Surgery : Do not apply any lotions/deodorants the morning of surgery.  Please wear clean clothes to the hospital/surgery center.  FAILURE TO FOLLOW THESE INSTRUCTIONS MAY RESULT IN THE CANCELLATION OF YOUR SURGERY PATIENT SIGNATURE_________________________________  NURSE SIGNATURE__________________________________  ________________________________________________________________________

## 2020-08-06 ENCOUNTER — Encounter (HOSPITAL_COMMUNITY): Payer: Self-pay

## 2020-08-06 ENCOUNTER — Other Ambulatory Visit: Payer: Self-pay

## 2020-08-06 ENCOUNTER — Encounter (HOSPITAL_COMMUNITY)
Admission: RE | Admit: 2020-08-06 | Discharge: 2020-08-06 | Disposition: A | Discharge status: Home / Self Care | Payer: Medicare Other | Source: Ambulatory Visit | Attending: Surgery | Admitting: Surgery

## 2020-08-06 DIAGNOSIS — Z01818 Encounter for other preprocedural examination: Secondary | ICD-10-CM | POA: Insufficient documentation

## 2020-08-06 DIAGNOSIS — E119 Type 2 diabetes mellitus without complications: Secondary | ICD-10-CM | POA: Diagnosis not present

## 2020-08-06 DIAGNOSIS — Z794 Long term (current) use of insulin: Secondary | ICD-10-CM | POA: Insufficient documentation

## 2020-08-06 LAB — BASIC METABOLIC PANEL
Anion gap: 11 (ref 5–15)
BUN: 29 mg/dL — ABNORMAL HIGH (ref 6–20)
CO2: 26 mmol/L (ref 22–32)
Calcium: 10 mg/dL (ref 8.9–10.3)
Chloride: 103 mmol/L (ref 98–111)
Creatinine, Ser: 1.4 mg/dL — ABNORMAL HIGH (ref 0.44–1.00)
GFR, Estimated: 43 mL/min — ABNORMAL LOW (ref >60)
Glucose, Bld: 80 mg/dL (ref 70–99)
Potassium: 4.6 mmol/L (ref 3.5–5.1)
Sodium: 140 mmol/L (ref 135–145)

## 2020-08-06 LAB — HEMOGLOBIN A1C
Hgb A1c MFr Bld: 6.2 % — ABNORMAL HIGH (ref 4.8–5.6)
Mean Plasma Glucose: 131.24 mg/dL

## 2020-08-06 LAB — CBC
HCT: 37.5 % (ref 36.0–46.0)
Hemoglobin: 12.1 g/dL (ref 12.0–15.0)
MCH: 29.7 pg (ref 26.0–34.0)
MCHC: 32.3 g/dL (ref 30.0–36.0)
MCV: 92.1 fL (ref 80.0–100.0)
Platelets: 432 10*3/uL — ABNORMAL HIGH (ref 150–400)
RBC: 4.07 MIL/uL (ref 3.87–5.11)
RDW: 12.5 % (ref 11.5–15.5)
WBC: 8.1 10*3/uL (ref 4.0–10.5)
nRBC: 0 % (ref 0.0–0.2)

## 2020-08-06 LAB — GLUCOSE, CAPILLARY: Glucose-Capillary: 87 mg/dL (ref 70–99)

## 2020-08-06 NOTE — Progress Notes (Signed)
COVID Vaccine Completed:No Date COVID Vaccine completed: COVID vaccine manufacturer: [employer]   PCP - Dr/ K. Clark Cardiologist - none  Chest x-ray - no EKG - 06/24/20-Epic Stress Test - no ECHO - no Cardiac Cath - no Pacemaker/ICD device last checked:NA  Sleep Study - yes CPAP - no  Fasting Blood Sugar - 80-123 Checks Blood Sugar __BID___ times a day  Blood Thinner Instructions:NA Aspirin Instructions: Last Dose:  Anesthesia review:   Patient denies shortness of breath, fever, cough and chest pain at PAT appointment Yes   Patient verbalized understanding of instructions that were given to them at the PAT appointment. Patient was also instructed that they will need to review over the PAT instructions again at home before surgery.Yes  Pt uses a cane due to peripheral neuropathy. She has diabetic gastroparesis and is concerned about aspiration  because it happened during an EGD in June of 2021.  Marland Kitchen She said she would have a full liquid diet the day before surgery. She reports no SOB while workimg or with ADLs

## 2020-08-07 ENCOUNTER — Other Ambulatory Visit (HOSPITAL_COMMUNITY)
Admission: RE | Admit: 2020-08-07 | Discharge: 2020-08-07 | Disposition: A | Discharge status: Home / Self Care | Payer: Medicare Other | Source: Ambulatory Visit | Attending: Surgery | Admitting: Surgery

## 2020-08-07 DIAGNOSIS — Z20822 Contact with and (suspected) exposure to covid-19: Secondary | ICD-10-CM | POA: Insufficient documentation

## 2020-08-07 DIAGNOSIS — Z01812 Encounter for preprocedural laboratory examination: Secondary | ICD-10-CM | POA: Insufficient documentation

## 2020-08-07 LAB — SARS CORONAVIRUS 2 (TAT 6-24 HRS): SARS Coronavirus 2: NEGATIVE

## 2020-08-10 MED ORDER — BUPIVACAINE LIPOSOME 1.3 % IJ SUSP
20.0000 mL | Freq: Once | INTRAMUSCULAR | Status: DC
  Filled 2020-08-10: qty 20

## 2020-08-10 NOTE — Anesthesia Preprocedure Evaluation (Addendum)
Anesthesia Evaluation  Patient identified by MRN, date of birth, ID band Patient awake    Reviewed: Allergy & Precautions, NPO status , Patient's Chart, lab work & pertinent test results  History of Anesthesia Complications (+) MALIGNANT HYPERTHERMIANegative for: history of anesthetic complications  Airway Mallampati: II  TM Distance: >3 FB     Dental no notable dental hx.    Pulmonary former smoker,    Pulmonary exam normal        Cardiovascular hypertension, Pt. on medications Normal cardiovascular exam  ECG: rate 87. Sinus rhythm Probable left atrial enlargement Left anterior fascicular block   Neuro/Psych PSYCHIATRIC DISORDERS Anxiety Depression negative neurological ROS     GI/Hepatic Neg liver ROS, hiatal hernia, GERD  Medicated and Controlled,  Endo/Other  diabetes, Insulin Dependent, Oral Hypoglycemic AgentsHypothyroidism   Renal/GU negative Renal ROS     Musculoskeletal Chronic back pain   Abdominal (+) - obese,   Peds  Hematology negative hematology ROS (+) HLD   Anesthesia Other Findings   Reproductive/Obstetrics                            Anesthesia Physical  Anesthesia Plan  ASA: III  Anesthesia Plan: General   Post-op Pain Management:    Induction: Intravenous  PONV Risk Score and Plan: 4 or greater and Ondansetron, Dexamethasone, Midazolam and Scopolamine patch - Pre-op  Airway Management Planned: Oral ETT  Additional Equipment:   Intra-op Plan:   Post-operative Plan: Extubation in OR  Informed Consent: I have reviewed the patients History and Physical, chart, labs and discussed the procedure including the risks, benefits and alternatives for the proposed anesthesia with the patient or authorized representative who has indicated his/her understanding and acceptance.     Dental advisory given  Plan Discussed with: CRNA and Anesthesiologist  Anesthesia  Plan Comments:        Anesthesia Quick Evaluation

## 2020-08-11 ENCOUNTER — Encounter (HOSPITAL_COMMUNITY): Payer: Self-pay | Admitting: Surgery

## 2020-08-11 ENCOUNTER — Ambulatory Visit (HOSPITAL_COMMUNITY): Payer: Medicare Other | Admitting: Anesthesiology

## 2020-08-11 ENCOUNTER — Ambulatory Visit (HOSPITAL_COMMUNITY)
Admission: RE | Admit: 2020-08-11 | Discharge: 2020-08-11 | Disposition: A | Discharge status: Home / Self Care | Payer: Medicare Other | Source: Other Acute Inpatient Hospital | Attending: Surgery | Admitting: Surgery

## 2020-08-11 ENCOUNTER — Encounter (HOSPITAL_COMMUNITY)
Admission: RE | Disposition: A | Discharge status: Home / Self Care | Payer: Self-pay | Source: Other Acute Inpatient Hospital | Attending: Surgery

## 2020-08-11 DIAGNOSIS — R1011 Right upper quadrant pain: Secondary | ICD-10-CM | POA: Diagnosis present

## 2020-08-11 DIAGNOSIS — E1143 Type 2 diabetes mellitus with diabetic autonomic (poly)neuropathy: Secondary | ICD-10-CM | POA: Diagnosis not present

## 2020-08-11 DIAGNOSIS — Z79890 Hormone replacement therapy (postmenopausal): Secondary | ICD-10-CM | POA: Insufficient documentation

## 2020-08-11 DIAGNOSIS — K811 Chronic cholecystitis: Secondary | ICD-10-CM | POA: Diagnosis not present

## 2020-08-11 DIAGNOSIS — Z79899 Other long term (current) drug therapy: Secondary | ICD-10-CM | POA: Insufficient documentation

## 2020-08-11 DIAGNOSIS — Z794 Long term (current) use of insulin: Secondary | ICD-10-CM | POA: Diagnosis not present

## 2020-08-11 DIAGNOSIS — K3184 Gastroparesis: Secondary | ICD-10-CM | POA: Diagnosis not present

## 2020-08-11 DIAGNOSIS — Z87891 Personal history of nicotine dependence: Secondary | ICD-10-CM | POA: Insufficient documentation

## 2020-08-11 HISTORY — PX: CHOLECYSTECTOMY: SHX55

## 2020-08-11 LAB — GLUCOSE, CAPILLARY: Glucose-Capillary: 87 mg/dL (ref 70–99)

## 2020-08-11 SURGERY — LAPAROSCOPIC CHOLECYSTECTOMY
Anesthesia: General | Site: Abdomen

## 2020-08-11 MED ORDER — LIDOCAINE 2% (20 MG/ML) 5 ML SYRINGE
INTRAMUSCULAR | Status: DC | PRN
  Administered 2020-08-11: 100 mg via INTRAVENOUS

## 2020-08-11 MED ORDER — MIDAZOLAM HCL 2 MG/2ML IJ SOLN
INTRAMUSCULAR | Status: DC | PRN
  Administered 2020-08-11: 2 mg via INTRAVENOUS

## 2020-08-11 MED ORDER — LACTATED RINGERS IR SOLN
Status: DC | PRN
  Administered 2020-08-11: 1000 mL

## 2020-08-11 MED ORDER — SCOPOLAMINE 1 MG/3DAYS TD PT72
1.0000 | MEDICATED_PATCH | TRANSDERMAL | Status: DC
  Administered 2020-08-11: 1.5 mg via TRANSDERMAL
  Filled 2020-08-11: qty 1

## 2020-08-11 MED ORDER — DEXAMETHASONE SODIUM PHOSPHATE 10 MG/ML IJ SOLN
INTRAMUSCULAR | Status: AC
  Filled 2020-08-11: qty 1

## 2020-08-11 MED ORDER — CHLORHEXIDINE GLUCONATE 4 % EX LIQD: 60.0000 mL | Freq: Once | CUTANEOUS | Status: DC

## 2020-08-11 MED ORDER — PROMETHAZINE HCL 25 MG/ML IJ SOLN: 6.2500 mg | INTRAMUSCULAR | Status: DC | PRN

## 2020-08-11 MED ORDER — FENTANYL CITRATE (PF) 250 MCG/5ML IJ SOLN
INTRAMUSCULAR | Status: AC
  Filled 2020-08-11: qty 5

## 2020-08-11 MED ORDER — TRAMADOL HCL 50 MG PO TABS
50.0000 mg | ORAL_TABLET | Freq: Four times a day (QID) | ORAL | 0 refills | Status: AC | PRN
Start: 2020-08-11 — End: 2021-08-11

## 2020-08-11 MED ORDER — DEXAMETHASONE SODIUM PHOSPHATE 10 MG/ML IJ SOLN
INTRAMUSCULAR | Status: DC | PRN
  Administered 2020-08-11: 10 mg via INTRAVENOUS

## 2020-08-11 MED ORDER — ONDANSETRON HCL 4 MG/2ML IJ SOLN
INTRAMUSCULAR | Status: DC | PRN
  Administered 2020-08-11: 4 mg via INTRAVENOUS

## 2020-08-11 MED ORDER — FENTANYL CITRATE (PF) 100 MCG/2ML IJ SOLN
25.0000 ug | INTRAMUSCULAR | Status: DC | PRN
  Administered 2020-08-11: 25 ug via INTRAVENOUS

## 2020-08-11 MED ORDER — LIDOCAINE 2% (20 MG/ML) 5 ML SYRINGE
INTRAMUSCULAR | Status: AC
  Filled 2020-08-11: qty 5

## 2020-08-11 MED ORDER — CHLORHEXIDINE GLUCONATE 0.12 % MT SOLN
15.0000 mL | Freq: Once | OROMUCOSAL | Status: AC
  Administered 2020-08-11: 15 mL via OROMUCOSAL

## 2020-08-11 MED ORDER — DOCUSATE SODIUM 100 MG PO CAPS: 100.0000 mg | ORAL_CAPSULE | Freq: Two times a day (BID) | ORAL | 0 refills | Status: AC

## 2020-08-11 MED ORDER — FENTANYL CITRATE (PF) 100 MCG/2ML IJ SOLN
INTRAMUSCULAR | Status: AC
  Administered 2020-08-11: 25 ug via INTRAVENOUS
  Filled 2020-08-11: qty 2

## 2020-08-11 MED ORDER — ONDANSETRON HCL 4 MG/2ML IJ SOLN
INTRAMUSCULAR | Status: AC
  Filled 2020-08-11: qty 2

## 2020-08-11 MED ORDER — CEFAZOLIN SODIUM-DEXTROSE 2-4 GM/100ML-% IV SOLN
2.0000 g | INTRAVENOUS | Status: AC
  Administered 2020-08-11: 2 g via INTRAVENOUS
  Filled 2020-08-11: qty 100

## 2020-08-11 MED ORDER — BUPIVACAINE-EPINEPHRINE 0.25% -1:200000 IJ SOLN
INTRAMUSCULAR | Status: DC | PRN
  Administered 2020-08-11: 30 mL

## 2020-08-11 MED ORDER — ROCURONIUM BROMIDE 10 MG/ML (PF) SYRINGE
PREFILLED_SYRINGE | INTRAVENOUS | Status: DC | PRN
  Administered 2020-08-11: 70 mg via INTRAVENOUS

## 2020-08-11 MED ORDER — SUGAMMADEX SODIUM 200 MG/2ML IV SOLN
INTRAVENOUS | Status: DC | PRN
  Administered 2020-08-11: 340 mg via INTRAVENOUS

## 2020-08-11 MED ORDER — EPHEDRINE SULFATE-NACL 50-0.9 MG/10ML-% IV SOSY
PREFILLED_SYRINGE | INTRAVENOUS | Status: DC | PRN
  Administered 2020-08-11: 10 mg via INTRAVENOUS

## 2020-08-11 MED ORDER — PROPOFOL 10 MG/ML IV BOLUS
INTRAVENOUS | Status: DC | PRN
  Administered 2020-08-11: 160 mg via INTRAVENOUS

## 2020-08-11 MED ORDER — BUPIVACAINE-EPINEPHRINE (PF) 0.25% -1:200000 IJ SOLN
INTRAMUSCULAR | Status: AC
  Filled 2020-08-11: qty 30

## 2020-08-11 MED ORDER — LACTATED RINGERS IV SOLN: INTRAVENOUS | Status: DC

## 2020-08-11 MED ORDER — MIDAZOLAM HCL 2 MG/2ML IJ SOLN
INTRAMUSCULAR | Status: AC
  Filled 2020-08-11: qty 2

## 2020-08-11 MED ORDER — CELECOXIB 200 MG PO CAPS
200.0000 mg | ORAL_CAPSULE | Freq: Once | ORAL | Status: AC
  Administered 2020-08-11: 200 mg via ORAL
  Filled 2020-08-11: qty 1

## 2020-08-11 MED ORDER — PHENYLEPHRINE 40 MCG/ML (10ML) SYRINGE FOR IV PUSH (FOR BLOOD PRESSURE SUPPORT)
PREFILLED_SYRINGE | INTRAVENOUS | Status: AC
  Filled 2020-08-11: qty 10

## 2020-08-11 MED ORDER — PHENYLEPHRINE 40 MCG/ML (10ML) SYRINGE FOR IV PUSH (FOR BLOOD PRESSURE SUPPORT)
PREFILLED_SYRINGE | INTRAVENOUS | Status: DC | PRN
  Administered 2020-08-11: 120 ug via INTRAVENOUS
  Administered 2020-08-11 (×2): 80 ug via INTRAVENOUS

## 2020-08-11 MED ORDER — ACETAMINOPHEN 500 MG PO TABS: 1000.0000 mg | ORAL_TABLET | Freq: Once | ORAL | Status: DC

## 2020-08-11 MED ORDER — ORAL CARE MOUTH RINSE: 15.0000 mL | Freq: Once | OROMUCOSAL | Status: AC

## 2020-08-11 MED ORDER — ROCURONIUM BROMIDE 10 MG/ML (PF) SYRINGE
PREFILLED_SYRINGE | INTRAVENOUS | Status: AC
  Filled 2020-08-11: qty 10

## 2020-08-11 MED ORDER — 0.9 % SODIUM CHLORIDE (POUR BTL) OPTIME
TOPICAL | Status: DC | PRN
  Administered 2020-08-11: 1000 mL

## 2020-08-11 MED ORDER — PROPOFOL 10 MG/ML IV BOLUS
INTRAVENOUS | Status: AC
  Filled 2020-08-11: qty 20

## 2020-08-11 MED ORDER — ACETAMINOPHEN 500 MG PO TABS
1000.0000 mg | ORAL_TABLET | ORAL | Status: AC
  Administered 2020-08-11: 1000 mg via ORAL
  Filled 2020-08-11: qty 2

## 2020-08-11 MED ORDER — FENTANYL CITRATE (PF) 250 MCG/5ML IJ SOLN
INTRAMUSCULAR | Status: DC | PRN
  Administered 2020-08-11 (×3): 50 ug via INTRAVENOUS

## 2020-08-11 SURGICAL SUPPLY — 39 items
ADH SKN CLS APL DERMABOND .7 (GAUZE/BANDAGES/DRESSINGS) ×1
APL PRP STRL LF DISP 70% ISPRP (MISCELLANEOUS) ×1
APPLIER CLIP ROT 10 11.4 M/L (STAPLE) ×2
APR CLP MED LRG 11.4X10 (STAPLE) ×1
BAG SPEC RTRVL LRG 6X4 10 (ENDOMECHANICALS) ×1
CABLE HIGH FREQUENCY MONO STRZ (ELECTRODE) ×2 IMPLANT
CHLORAPREP W/TINT 26 (MISCELLANEOUS) ×2 IMPLANT
CLIP APPLIE ROT 10 11.4 M/L (STAPLE) ×1 IMPLANT
COVER MAYO STAND STRL (DRAPES) IMPLANT
COVER SURGICAL LIGHT HANDLE (MISCELLANEOUS) ×2 IMPLANT
COVER WAND RF STERILE (DRAPES) IMPLANT
DECANTER SPIKE VIAL GLASS SM (MISCELLANEOUS) ×2 IMPLANT
DERMABOND ADVANCED (GAUZE/BANDAGES/DRESSINGS) ×1
DERMABOND ADVANCED .7 DNX12 (GAUZE/BANDAGES/DRESSINGS) ×1 IMPLANT
DRAPE C-ARM 42X120 X-RAY (DRAPES) IMPLANT
ELECT REM PT RETURN 15FT ADLT (MISCELLANEOUS) ×2 IMPLANT
GLOVE BIO SURGEON STRL SZ 6 (GLOVE) ×2 IMPLANT
GLOVE INDICATOR 6.5 STRL GRN (GLOVE) ×2 IMPLANT
GOWN STRL REUS W/TWL LRG LVL3 (GOWN DISPOSABLE) ×3 IMPLANT
GOWN STRL REUS W/TWL XL LVL3 (GOWN DISPOSABLE) ×4 IMPLANT
GRASPER SUT TROCAR 14GX15 (MISCELLANEOUS) ×2 IMPLANT
HEMOSTAT SNOW SURGICEL 2X4 (HEMOSTASIS) IMPLANT
KIT BASIN OR (CUSTOM PROCEDURE TRAY) ×2 IMPLANT
KIT TURNOVER KIT A (KITS) ×1 IMPLANT
NDL INSUFFLATION 14GA 120MM (NEEDLE) ×1 IMPLANT
NEEDLE INSUFFLATION 14GA 120MM (NEEDLE) ×2 IMPLANT
PENCIL SMOKE EVACUATOR (MISCELLANEOUS) IMPLANT
POUCH SPECIMEN RETRIEVAL 10MM (ENDOMECHANICALS) ×2 IMPLANT
SCISSORS LAP 5X35 DISP (ENDOMECHANICALS) ×2 IMPLANT
SET CHOLANGIOGRAPH MIX (MISCELLANEOUS) IMPLANT
SET IRRIG TUBING LAPAROSCOPIC (IRRIGATION / IRRIGATOR) ×2 IMPLANT
SET TUBE SMOKE EVAC HIGH FLOW (TUBING) ×2 IMPLANT
SLEEVE XCEL OPT CAN 5 100 (ENDOMECHANICALS) ×4 IMPLANT
SUT MNCRL AB 4-0 PS2 18 (SUTURE) ×2 IMPLANT
TOWEL OR 17X26 10 PK STRL BLUE (TOWEL DISPOSABLE) ×2 IMPLANT
TOWEL OR NON WOVEN STRL DISP B (DISPOSABLE) ×1 IMPLANT
TRAY LAPAROSCOPIC (CUSTOM PROCEDURE TRAY) ×2 IMPLANT
TROCAR BLADELESS OPT 5 100 (ENDOMECHANICALS) ×2 IMPLANT
TROCAR XCEL 12X100 BLDLESS (ENDOMECHANICALS) ×2 IMPLANT

## 2020-08-11 NOTE — Anesthesia Procedure Notes (Signed)
Performed by: Tranise Forrest L, CRNA       

## 2020-08-11 NOTE — H&P (Signed)
Surgical Evaluation  HPI: This is a pleasant 59 year old woman who presents today with chronic right upper quadrant pain, nausea, vomiting. She has a complex gastrointestinal history. She had a long-standing history of insulin dependent diabetes, which was uncontrolled until recently. She has been having digestive issues and GI functional issues for many years, but for at least the last year she has been having worsening symptoms including postprandial nausea, bloating, sometimes emesis, and describes a focus of bloating in the right upper quadrant, right upper quadrant pain which radiates to the back and sometimes in the lower abdomen. She has gastroparesis documented on gastric emptying study earlier this year. A CT scan before that was within normal limits. She also has a history of chronic constipation and history of anal fissure requiring chemical sphincterotomy. She has undergone a pretty exhaustive workup and has been diagnosed with borderline pancreatic insufficiency as well as some type of intestinal bacterial overgrowth based on a hydrogen breath test, and is taking Creon as well as has been treated with antibiotics. She underwent a right upper quadrant ultrasound which was negative for gallstones or other anatomic pathology although the patient reports to me that she has fatty liver. She had a HIDA scan which was normal and documented ejection fraction around 60%. She has eliminated several types of food from her diet and has done her best to be compliant with recommendations regarding eating patterns for the gastroparesis. Despite this she continues to have symptoms, and has been referred here to discuss possible cholecystectomy.  She has never had any prior abdominal surgery. She denies any known heart disease or symptoms.       Allergies  Allergen Reactions  . Trileptal [Oxcarbazepine]     vomiting  . Oxycodone Itching        Past Medical History:  Diagnosis Date  .  Anxiety   . Arthritis    Acromioclavicular  . Barrett syndrome   . Chronic back pain   . Depression   . Diabetes mellitus without complication ([place])   . GERD (gastroesophageal reflux disease)   . Hepatitis    PMH: Hep A  . History of hiatal hernia   . Hypercholesterolemia   . Hypertension   . Hypothyroidism   . Torn rotator cuff    left, with impingement syndrome  . Wears glasses          Past Surgical History:  Procedure Laterality Date  . ANAL FISSURE REPAIR     s/p fissurectomy with Botox sphincterctomy 04/13/17  . BACK SURGERY    . CARPAL TUNNEL RELEASE    . CERVICAL ABLATION    . COLONOSCOPY    . CYSTECTOMY    . elbow     right lateral epicondylitis  . ESOPHAGEAL MANOMETRY N/A 07/31/2018   Procedure: ESOPHAGEAL MANOMETRY (EM);  Surgeon: Wilford Corner, MD;  Location: [facility];  Service: Endoscopy;  Laterality: N/A;  . HERNIA REPAIR    . KNEE SURGERY     arthroscopy on right knee  . [place] IMPEDANCE STUDY N/A 07/31/2018   Procedure: [place] IMPEDANCE STUDY;  Surgeon: Wilford Corner, MD;  Location: [facility];  Service: Endoscopy;  Laterality: N/A;  . SHOULDER ARTHROSCOPY     right   . SHOULDER ARTHROSCOPY WITH OPEN ROTATOR CUFF REPAIR AND DISTAL CLAVICLE ACROMINECTOMY Left 08/13/2018   Procedure: LEFT SHOULDER ARTHROSCOPY WITH SUBACROMIAL DECOMPRESSION, DISTAL CLAVICLE RESECTION, MINI-OPEN ROTATOR CUFF REPAIR AND POSSIBLE BICEPS TENODESIS;  Surgeon: Garald Balding, MD;  Location: [facility];  Service: Orthopedics;  Laterality: Left;         Family History  Problem Relation Age of Onset  . Diabetes Father   . Heart disease Father     Social History        Socioeconomic History  . Marital status: Single    Spouse name: Not on file  . Number of children: Not on file  . Years of education: Not on file  . Highest education level: Not on file  Occupational History  . Not on file  Tobacco Use  . Smoking  status: Former Smoker    Packs/day: 1.00    Years: 26.00    Pack years: 26.00    Types: Cigarettes    Start date: 01/20/1975    Quit date: 06/25/2014    Years since quitting: 6.0  . Smokeless tobacco: Never Used  Vaping Use  . Vaping Use: Former  Substance and Sexual Activity  . Alcohol use: Yes    Alcohol/week: 0.0 standard drinks    Comment: rare  . Drug use: Yes    Types: Marijuana    Comment: last use October 2019  . Sexual activity: Not on file  Other Topics Concern  . Not on file  Social History Narrative  . Not on file   Social Determinants of Health      Financial Resource Strain:   . Difficulty of Paying Living Expenses: Not on file  Food Insecurity:   . Worried About Charity fundraiser in the Last Year: Not on file  . Ran Out of Food in the Last Year: Not on file  Transportation Needs:   . Lack of Transportation (Medical): Not on file  . Lack of Transportation (Non-Medical): Not on file  Physical Activity:   . Days of Exercise per Week: Not on file  . Minutes of Exercise per Session: Not on file  Stress:   . Feeling of Stress : Not on file  Social Connections:   . Frequency of Communication with Friends and Family: Not on file  . Frequency of Social Gatherings with Friends and Family: Not on file  . Attends Religious Services: Not on file  . Active Member of Clubs or Organizations: Not on file  . Attends Archivist Meetings: Not on file  . Marital Status: Not on file          Current Outpatient Medications on File Prior to Visit  Medication Sig Dispense Refill  . ALPRAZolam (XANAX) 1 MG tablet Take 1 mg by mouth at bedtime as needed for anxiety.    Marland Kitchen atorvastatin (LIPITOR) 80 MG tablet Take 80 mg by mouth daily.     . cyclobenzaprine (FLEXERIL) 10 MG tablet Take 1 tablet (10 mg total) by mouth 3 (three) times daily as needed for muscle spasms. 30 tablet 0  . dexlansoprazole (DEXILANT) 60 MG capsule Take 60 mg by  mouth daily.     Marland Kitchen FLUoxetine (PROZAC) 20 MG tablet Take 20 mg by mouth 2 (two) times daily.     Marland Kitchen HYDROcodone-acetaminophen (NORCO) 5-325 MG tablet Take 1 tablet by mouth every 4 (four) hours as needed for moderate pain or severe pain. 40 tablet 0  . insulin glargine (LANTUS) 100 UNIT/ML injection Inject 70 Units into the skin at bedtime.    Marland Kitchen levothyroxine (SYNTHROID, LEVOTHROID) 112 MCG tablet Take 112 mcg by mouth daily before breakfast.     . lisinopril (PRINIVIL,ZESTRIL) 5 MG tablet Take 5 mg by mouth daily.     Marland Kitchen  metFORMIN (GLUCOPHAGE) 1000 MG tablet Take 1,000 mg by mouth 2 (two) times daily with a meal.    . ondansetron (ZOFRAN ODT) 8 MG disintegrating tablet Take 1 tablet (8 mg total) by mouth every 8 (eight) hours as needed for nausea or vomiting. 10 tablet 0  . OXcarbazepine (TRILEPTAL) 150 MG tablet Take 75 mg by mouth 2 (two) times daily.     No current facility-administered medications on file prior to visit.    Review of Systems: a complete, 10pt review of systems was completed with pertinent positives and negatives as documented in the HPI  Physical Exam: Vitals  Weight: 160.13 lb Height: 64in Body Surface Area: 1.78 m Body Mass Index: 27.49 kg/m  Temp.: 97.66F  Pulse: 108 (Regular)  P.OX: 95% (Room air) BP: 120/90(Sitting, Left Arm, Standard)   Alert and cooperative Unlabored respirations Status post bilateral mastectomy Abdomen is soft, nondistended, no palpable hepatosplenomegaly, there is tenderness along the epigastrium and right subcostal region extending along the right hemiabdomen without peritoneal signs, no mass   CBC Latest Ref Rng & Units 08/12/2018 07/09/2018 11/19/2014  WBC 4.0 - 10.5 K/uL 9.9 9.2 8.4  Hemoglobin 12.0 - 15.0 g/dL 12.7 12.3 14.6  Hematocrit 36 - 46 % 40.1 38.3 43.2  Platelets 150 - 400 K/uL 459(H) 454(H) 414(H)    CMP Latest Ref Rng & Units 08/12/2018 07/09/2018 11/19/2014  Glucose 70 - 99 mg/dL  240(H) 106(H) 181(H)  BUN 6 - 20 mg/dL 17 14 13   Creatinine 0.44 - 1.00 mg/dL 0.95 0.82 0.8  Sodium 135 - 145 mmol/L 134(L) 138 142  Potassium 3.5 - 5.1 mmol/L 4.0 4.0 4.0  Chloride 98 - 111 mmol/L 101 104 97(L)  CO2 22 - 32 mmol/L 22 25 26   Calcium 8.9 - 10.3 mg/dL 9.3 9.1 9.4  Total Protein 6.5 - 8.1 g/dL - 7.3 7.7  Total Bilirubin 0.3 - 1.2 mg/dL - 0.1(L) 0.70  Alkaline Phos 38 - 126 U/L - 63 66  AST 15 - 41 U/L - 21 23  ALT 0 - 44 U/L - 21 19    Recent Labs  No results found for: INR, PROTIME    Imaging: Imaging Results (Last 48 hours)  No results found.     A/P: ABDOMINAL PAIN, CHRONIC, RIGHT UPPER QUADRANT (R10.11) Story: I had a long discussion with the patient and her mother regarding her diagnoses and symptoms. We discussed the anatomy and function of the biliary tract. I discussed that in the setting of multiple other GI issues and lack of objective data demonstrating gallbladder pathology, I cannot guarantee or even confidently suggest that she will have any improvement from undergoing cholecystectomy. I advised her that I am hesitant to offer her surgery due to this in addition to the fact that surgery is not without risk- and advised in detail that this includes bleeding, infection, pain, scarring, intraabdominal injury specifically to the common bile duct and sequelae, bile leak and sequelae, conversion to open surgery, failure to resolve symptoms, blood clots/ pulmonary embolus, heart attack, pneumonia, stroke, death, and possibly worsening of her GI tract function/ symptoms. Questions welcomed and answered to patient's satisfaction. She expressed understanding, and strongly desires to proceed with laparoscopic cholecystectomy understanding that it unlikely to be a panacea for her GI issues or pain.        Patient Active Problem List   Diagnosis Date Noted  . Carpal tunnel syndrome, left upper limb 05/29/2019  . Unspecified rotator cuff tear or rupture of  right  shoulder, not specified as traumatic 08/13/2018  . Impingement syndrome of left shoulder 08/13/2018  . Osteoarthritis of left AC (acromioclavicular) joint 08/13/2018  . Lumbar spinal stenosis 08/06/2014       Romana Juniper, MD [facility], [place]

## 2020-08-11 NOTE — Anesthesia Postprocedure Evaluation (Signed)
Anesthesia Post Note  Patient: April Obrien  Procedure(s) Performed: LAPAROSCOPIC CHOLECYSTECTOMY (N/A Abdomen)     Patient location during evaluation: PACU Anesthesia Type: General Level of consciousness: sedated Pain management: pain level controlled Vital Signs Assessment: post-procedure vital signs reviewed and stable Respiratory status: spontaneous breathing and respiratory function stable Cardiovascular status: stable Postop Assessment: no apparent nausea or vomiting Anesthetic complications: no   No complications documented.  Last Vitals:  Vitals:   08/11/20 1045 08/11/20 1100  BP: (!) 162/70 (!) 151/65  Pulse: 83 84  Resp: 12 16  Temp:  36.4 C  SpO2: 95% 96%    Last Pain:  Vitals:   08/11/20 1100  TempSrc:   PainSc: 0-No pain                 [name] DANIEL

## 2020-08-11 NOTE — Op Note (Signed)
Operative Note  April Obrien 59 y.o. female [id number]  08/11/2020  Surgeon: Clovis Riley MD FACS  Assistant: Louanna Raw MD  Procedure performed: Laparoscopic Cholecystectomy  Preop diagnosis: chronic abdominal pain Post-op diagnosis/intraop findings: same, likely chronic cholecystitis  Specimens: gallbladder  Retained items: none  EBL: minimal  Complications: none  Description of procedure: After obtaining informed consent the patient was brought to the operating room. Antibiotics were administered. SCD's were applied. General endotracheal anesthesia was initiated and a formal time-out was performed. The abdomen was prepped and draped in the usual sterile fashion and the abdomen was entered using an infraumbilical veress needle after instilling the site with local. Insufflation to 75mmHg was obtained, 20mm trocar and camera inserted, and gross inspection revealed no evidence of injury from our entry or other intraabdominal abnormalities. Two 30mm trocars were introduced in the right midclavicular and right anterior axillary lines under direct visualization and following infiltration with local. An 59mm trocar was placed in the epigastrium. Omental adhesions to the gallbladder were lysed bluntly and with cautery where necessary, protecting the nearby duodenum and colon. The gallbladder was retracted cephalad and the infundibulum was retracted laterally. A combination of hook electrocautery and blunt dissection was utilized to clear the peritoneum from the neck and cystic duct, circumferentially isolating the cystic artery and cystic duct and lifting the gallbladder from the cystic plate. The critical view of safety was achieved with the cystic artery, cystic duct, and liver bed visualized between them with no other structures. The artery was clipped with a two clips proximally and one distally and divided as was the cystic duct with three clips on the proximal end. The gallbladder  was dissected from the liver plate using electrocautery. Once freed the gallbladder was placed in an endocatch bag and removed through the epigastric trocar site. A small amount of bleeding on the liver bed was controlled with cautery. Some bile had been spilled from the gallbladder during its dissection from the liver bed. This was aspirated and the right upper quadrant was irrigated copiously until the effluent was clear. Hemostasis was once again confirmed, and reinspection of the abdomen revealed no injuries. The clips were well opposed without any bile leak from the duct or the liver bed. The 73mm trocar site in the epigastrium was closed with a 0 vicryl in the fascia under direct visualization using a PMI device. The abdomen was desufflated and all trocars removed. The skin incisions were closed with subcuticular monocryl and Dermabond. The patient was awakened, extubated and transported to the recovery room in stable condition.    All counts were correct at the completion of the case.

## 2020-08-11 NOTE — Discharge Instructions (Signed)
LAPAROSCOPIC SURGERY: POST OP INSTRUCTIONS   EAT Gradually transition to a high fiber diet with a fiber supplement over the next few weeks after discharge.  Start with a pureed / full liquid diet (see below)  WALK Walk an hour a day.  Control your pain to do that.    CONTROL PAIN Control pain so that you can walk, sleep, tolerate sneezing/coughing, go up/down stairs.  HAVE A BOWEL MOVEMENT DAILY Keep your bowels regular to avoid problems.  OK to try a laxative to override constipation.  OK to use an antidairrheal to slow down diarrhea.  Call if not better after 2 tries  CALL IF YOU HAVE PROBLEMS/CONCERNS Call if you are still struggling despite following these instructions. Call if you have concerns not answered by these instructions    1. DIET: Follow a light bland diet & liquids the first 24 hours after arrival home, such as soup, liquids, starches, etc.  Be sure to drink plenty of fluids.  Quickly advance to a usual solid diet within a few days.  Avoid fast food or heavy meals as your are more likely to get nauseated or have irregular bowels.  A low-sugar, high-fiber diet for the rest of your life is ideal.  2. Take your usually prescribed home medications unless otherwise directed.  3. PAIN CONTROL: a. Pain is best controlled by a usual combination of three different methods TOGETHER: i. Ice/Heat ii. Over the counter pain medication iii. Prescription pain medication b. Most patients will experience some swelling and bruising around the incisions.  Ice packs or heating pads (30-60 minutes up to 6 times a day) will help. Use ice for the first few days to help decrease swelling and bruising, then switch to heat to help relax tight/sore spots and speed recovery.  Some people prefer to use ice alone, heat alone, alternating between ice & heat.  Experiment to what works for you.  Swelling and bruising can take several weeks to resolve.   c. It is helpful to take an over-the-counter pain  medication regularly for the first few days: i. Naproxen (Aleve, etc)  Two 220mg tabs twice a day OR Ibuprofen (Advil, etc) Three 200mg tabs four times a day (every meal & bedtime) AND ii. Acetaminophen (Tylenol, etc) 500-650mg four times a day (every meal & bedtime) d. A  prescription for pain medication (such as oxycodone, hydrocodone, tramadol, gabapentin, methocarbamol, etc) should be given to you upon discharge.  Take your pain medication as prescribed, IF NEEDED.  i. If you are having problems/concerns with the prescription medicine (does not control pain, nausea, vomiting, rash, itching, etc), please call us [phone] to see if we need to switch you to a different pain medicine that will work better for you and/or control your side effect better. ii. If you need a refill on your pain medication, please give us 48 hour notice.  contact your pharmacy.  They will contact our office to request authorization. Prescriptions will not be filled after 5 pm or on week-ends  4. Avoid getting constipated.   a. Between the surgery and the pain medications, it is common to experience some constipation.   b. Increasing fluid intake and taking a fiber supplement (such as Metamucil, Citrucel, FiberCon, MiraLax, etc) 1-2 times a day regularly will usually help prevent this problem from occurring.   c. A mild laxative (prune juice, Milk of Magnesia, MiraLax, etc) should be taken according to package directions if there are no bowel movements after 48 hours.     5. Watch out for diarrhea.   a. If you have many loose bowel movements, simplify your diet to bland foods & liquids for a few days.   b. Stop any stool softeners and decrease your fiber supplement.   c. Switching to mild anti-diarrheal medications (Kayopectate, Pepto Bismol) can help.   d. If this worsens or does not improve, please call us.  6. Wash / shower every day.  You may shower over the skin glue which is waterproof  7. Glue will flake off  after about 2 weeks.  You may leave the incision open to air.  You may replace a dressing/Band-Aid to cover the incision for comfort if you wish.   8. ACTIVITIES as tolerated:   a. You may resume regular (light) daily activities beginning the next day--such as daily self-care, walking, climbing stairs--gradually increasing activities as tolerated.  If you can walk 30 minutes without difficulty, it is safe to try more intense activity such as jogging, treadmill, bicycling, low-impact aerobics, swimming, etc. b. Save the most intensive and strenuous activity for last such as sit-ups, heavy lifting, contact sports, etc  Refrain from any heavy lifting or straining until you are off narcotics for pain control.   c. DO NOT PUSH THROUGH PAIN.  Let pain be your guide: If it hurts to do something, don't do it.  Pain is your body warning you to avoid that activity for another week until the pain goes down. d. You may drive when you are no longer taking prescription pain medication, you can comfortably wear a seatbelt, and you can safely maneuver your car and apply brakes. e. You may have sexual intercourse when it is comfortable.  9. FOLLOW UP in our office a. Please call [facility] at [phone] to set up an appointment to see your surgeon in the office for a follow-up appointment approximately 2-3 weeks after your surgery. b. Make sure that you call for this appointment the day you arrive home to insure a convenient appointment time.  10. IF YOU HAVE DISABILITY OR FAMILY LEAVE FORMS, BRING THEM TO THE OFFICE FOR PROCESSING.  DO NOT GIVE THEM TO YOUR DOCTOR.   WHEN TO CALL US [phone]: 1. Poor pain control 2. Reactions / problems with new medications (rash/itching, nausea, etc)  3. Fever over 101.5 F (38.5 C) 4. Inability to urinate 5. Nausea and/or vomiting 6. Worsening swelling or bruising 7. Continued bleeding from incision. 8. Increased pain, redness, or drainage from the incision   The  clinic staff is available to answer your questions during regular business hours (8:30am-5pm).  Please don't hesitate to call and ask to speak to one of our nurses for clinical concerns.   If you have a medical emergency, go to the nearest emergency room or call 911.  A surgeon from [facility] is always on call at the hospitals   [facility] [address], [address], [place], [place]  [address] ? MAIN: [phone] ? TOLL FREE: [phone] ?  FAX [phone] [email or web address]   

## 2020-08-11 NOTE — Anesthesia Procedure Notes (Signed)
Procedure Name: Intubation Date/Time: 08/11/2020 8:12 AM Performed by: Sharlette Dense, CRNA Patient Re-evaluated:Patient Re-evaluated prior to induction Oxygen Delivery Method: Circle system utilized Preoxygenation: Pre-oxygenation with 100% oxygen Induction Type: IV induction Ventilation: Mask ventilation without difficulty and Oral airway inserted - appropriate to patient size Laryngoscope Size: Sabra Heck and 2 Grade View: Grade I Tube type: Oral Tube size: 7.5 mm Number of attempts: 1 Airway Equipment and Method: Stylet Placement Confirmation: ETT inserted through vocal cords under direct vision,  CO2 detector and breath sounds checked- equal and bilateral Secured at: 21 cm Dental Injury: Teeth and Oropharynx as per pre-operative assessment

## 2020-08-11 NOTE — Transfer of Care (Signed)
Immediate Anesthesia Transfer of Care Note  Patient: April Obrien  Procedure(s) Performed: LAPAROSCOPIC CHOLECYSTECTOMY (N/A Abdomen)  Patient Location: PACU  Anesthesia Type:General  Level of Consciousness: drowsy  Airway & Oxygen Therapy: Patient Spontanous Breathing and Patient connected to face mask oxygen  Post-op Assessment: Report given to RN and Post -op Vital signs reviewed and stable  Post vital signs: Reviewed and stable  Last Vitals:  Vitals Value Taken Time  BP 159/88 08/11/20 0917  Temp    Pulse 79 08/11/20 0918  Resp 11 08/11/20 0918  SpO2 100 % 08/11/20 0918  Vitals shown include unvalidated device data.  Last Pain:  Vitals:   08/11/20 0722  TempSrc: Oral  PainSc:       Patients Stated Pain Goal: 3 (39/[id number])  Complications: No complications documented.

## 2020-08-12 ENCOUNTER — Encounter (HOSPITAL_COMMUNITY): Payer: Self-pay | Admitting: Surgery

## 2020-08-12 LAB — SURGICAL PATHOLOGY

## 2020-08-14 ENCOUNTER — Other Ambulatory Visit: Payer: Self-pay | Admitting: Surgery

## 2020-08-14 ENCOUNTER — Encounter: Payer: Self-pay | Admitting: Surgery
